# Patient Record
Sex: Female | Born: 1985 | Race: Black or African American | Hispanic: No | Marital: Single | Smoking: Former smoker
Health system: Southern US, Community
[De-identification: ages and names within clinical notes are randomized; demographics above are authoritative.]

## PROBLEM LIST (undated history)

## (undated) ENCOUNTER — Inpatient Hospital Stay (HOSPITAL_COMMUNITY): Payer: Self-pay

## (undated) DIAGNOSIS — Z789 Other specified health status: Secondary | ICD-10-CM

## (undated) DIAGNOSIS — N390 Urinary tract infection, site not specified: Secondary | ICD-10-CM

## (undated) DIAGNOSIS — C50919 Malignant neoplasm of unspecified site of unspecified female breast: Secondary | ICD-10-CM

## (undated) HISTORY — PX: MASTECTOMY: SHX3

## (undated) HISTORY — PX: DILATION AND CURETTAGE OF UTERUS: SHX78

## (undated) HISTORY — PX: PLACEMENT OF BREAST IMPLANTS: SHX6334

---
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
## 2007-03-22 ENCOUNTER — Emergency Department (HOSPITAL_COMMUNITY): Admission: EM | Admit: 2007-03-22 | Discharge: 2007-03-22 | Payer: Self-pay | Admitting: Emergency Medicine

## 2009-01-17 ENCOUNTER — Emergency Department (HOSPITAL_COMMUNITY): Admission: EM | Admit: 2009-01-17 | Discharge: 2009-01-17 | Payer: Self-pay | Admitting: Emergency Medicine

## 2009-08-02 ENCOUNTER — Emergency Department (HOSPITAL_COMMUNITY): Admission: EM | Admit: 2009-08-02 | Discharge: 2009-08-02 | Payer: Self-pay | Admitting: Emergency Medicine

## 2010-05-26 ENCOUNTER — Emergency Department (HOSPITAL_COMMUNITY): Admission: EM | Admit: 2010-05-26 | Discharge: 2010-05-27 | Payer: Self-pay | Admitting: Emergency Medicine

## 2010-12-12 LAB — DIFFERENTIAL
Basophils Absolute: 0.1 10*3/uL (ref 0.0–0.1)
Basophils Relative: 1 % (ref 0–1)
Eosinophils Absolute: 0.1 10*3/uL (ref 0.0–0.7)
Eosinophils Relative: 1 % (ref 0–5)
Lymphocytes Relative: 22 % (ref 12–46)
Lymphs Abs: 2.2 10*3/uL (ref 0.7–4.0)
Monocytes Absolute: 0.6 10*3/uL (ref 0.1–1.0)
Monocytes Relative: 6 % (ref 3–12)
Neutro Abs: 7.2 10*3/uL (ref 1.7–7.7)
Neutrophils Relative %: 71 % (ref 43–77)

## 2010-12-12 LAB — URINALYSIS, ROUTINE W REFLEX MICROSCOPIC
Bilirubin Urine: NEGATIVE
Glucose, UA: NEGATIVE mg/dL
Ketones, ur: 15 mg/dL — AB
Nitrite: NEGATIVE
Protein, ur: NEGATIVE mg/dL
Specific Gravity, Urine: 1.021 (ref 1.005–1.030)
Urobilinogen, UA: 0.2 mg/dL (ref 0.0–1.0)
pH: 5 (ref 5.0–8.0)

## 2010-12-12 LAB — URINE MICROSCOPIC-ADD ON

## 2010-12-12 LAB — CBC
HCT: 36 % (ref 36.0–46.0)
Hemoglobin: 12.5 g/dL (ref 12.0–15.0)
MCH: 27.7 pg (ref 26.0–34.0)
MCHC: 34.7 g/dL (ref 30.0–36.0)
MCV: 79.6 fL (ref 78.0–100.0)
Platelets: 256 10*3/uL (ref 150–400)
RBC: 4.52 MIL/uL (ref 3.87–5.11)
RDW: 13.6 % (ref 11.5–15.5)
WBC: 10.2 10*3/uL (ref 4.0–10.5)

## 2010-12-12 LAB — WET PREP, GENITAL
Clue Cells Wet Prep HPF POC: NONE SEEN
Trich, Wet Prep: NONE SEEN
WBC, Wet Prep HPF POC: NONE SEEN
Yeast Wet Prep HPF POC: NONE SEEN

## 2010-12-12 LAB — GC/CHLAMYDIA PROBE AMP, GENITAL
Chlamydia, DNA Probe: NEGATIVE
GC Probe Amp, Genital: NEGATIVE

## 2010-12-12 LAB — PREGNANCY, URINE: Preg Test, Ur: NEGATIVE

## 2010-12-31 LAB — POCT PREGNANCY, URINE: Preg Test, Ur: NEGATIVE

## 2010-12-31 LAB — GC/CHLAMYDIA PROBE AMP, GENITAL
Chlamydia, DNA Probe: NEGATIVE
GC Probe Amp, Genital: NEGATIVE

## 2010-12-31 LAB — WET PREP, GENITAL
Clue Cells Wet Prep HPF POC: NONE SEEN
Trich, Wet Prep: NONE SEEN
Yeast Wet Prep HPF POC: NONE SEEN

## 2010-12-31 LAB — URINE MICROSCOPIC-ADD ON

## 2010-12-31 LAB — URINALYSIS, ROUTINE W REFLEX MICROSCOPIC
Bilirubin Urine: NEGATIVE
Glucose, UA: NEGATIVE mg/dL
Ketones, ur: NEGATIVE mg/dL
Nitrite: POSITIVE — AB
Protein, ur: NEGATIVE mg/dL
Specific Gravity, Urine: 1.023 (ref 1.005–1.030)
Urobilinogen, UA: 0.2 mg/dL (ref 0.0–1.0)
pH: 6 (ref 5.0–8.0)

## 2011-01-03 ENCOUNTER — Emergency Department (HOSPITAL_COMMUNITY): Payer: Self-pay

## 2011-01-03 ENCOUNTER — Emergency Department (HOSPITAL_COMMUNITY)
Admission: EM | Admit: 2011-01-03 | Discharge: 2011-01-04 | Disposition: A | Discharge status: Home / Self Care | Payer: Self-pay | Attending: Emergency Medicine | Admitting: Emergency Medicine

## 2011-01-03 DIAGNOSIS — R51 Headache: Secondary | ICD-10-CM | POA: Insufficient documentation

## 2011-01-03 DIAGNOSIS — R079 Chest pain, unspecified: Secondary | ICD-10-CM | POA: Insufficient documentation

## 2011-01-03 DIAGNOSIS — R0789 Other chest pain: Secondary | ICD-10-CM | POA: Insufficient documentation

## 2011-01-07 LAB — URINALYSIS, ROUTINE W REFLEX MICROSCOPIC
Bilirubin Urine: NEGATIVE
Glucose, UA: NEGATIVE mg/dL
Ketones, ur: NEGATIVE mg/dL
Nitrite: POSITIVE — AB
Protein, ur: 30 mg/dL — AB
Specific Gravity, Urine: 1.02 (ref 1.005–1.030)
Urobilinogen, UA: 1 mg/dL (ref 0.0–1.0)
pH: 7 (ref 5.0–8.0)

## 2011-01-07 LAB — URINE MICROSCOPIC-ADD ON

## 2011-01-07 LAB — GC/CHLAMYDIA PROBE AMP, GENITAL
Chlamydia, DNA Probe: NEGATIVE
GC Probe Amp, Genital: NEGATIVE

## 2011-01-07 LAB — WET PREP, GENITAL
Trich, Wet Prep: NONE SEEN
Yeast Wet Prep HPF POC: NONE SEEN

## 2011-01-07 LAB — CBC
HCT: 36.1 % (ref 36.0–46.0)
Hemoglobin: 12.3 g/dL (ref 12.0–15.0)
MCHC: 34.1 g/dL (ref 30.0–36.0)
MCV: 82.4 fL (ref 78.0–100.0)
Platelets: 196 10*3/uL (ref 150–400)
RBC: 4.38 MIL/uL (ref 3.87–5.11)
RDW: 13.1 % (ref 11.5–15.5)
WBC: 6.2 10*3/uL (ref 4.0–10.5)

## 2011-01-07 LAB — DIFFERENTIAL
Basophils Absolute: 0 10*3/uL (ref 0.0–0.1)
Basophils Relative: 0 % (ref 0–1)
Eosinophils Absolute: 0.1 10*3/uL (ref 0.0–0.7)
Eosinophils Relative: 1 % (ref 0–5)
Lymphocytes Relative: 17 % (ref 12–46)
Lymphs Abs: 1 10*3/uL (ref 0.7–4.0)
Monocytes Absolute: 0.4 10*3/uL (ref 0.1–1.0)
Monocytes Relative: 7 % (ref 3–12)
Neutro Abs: 4.7 10*3/uL (ref 1.7–7.7)
Neutrophils Relative %: 76 % (ref 43–77)

## 2011-01-07 LAB — POCT I-STAT, CHEM 8
BUN: 10 mg/dL (ref 6–23)
Calcium, Ion: 1.18 mmol/L (ref 1.12–1.32)
Chloride: 106 mEq/L (ref 96–112)
Creatinine, Ser: 0.9 mg/dL (ref 0.4–1.2)
Glucose, Bld: 103 mg/dL — ABNORMAL HIGH (ref 70–99)
HCT: 38 % (ref 36.0–46.0)
Hemoglobin: 12.9 g/dL (ref 12.0–15.0)
Potassium: 3.7 mEq/L (ref 3.5–5.1)
Sodium: 139 mEq/L (ref 135–145)
TCO2: 26 mmol/L (ref 0–100)

## 2011-01-07 LAB — POCT PREGNANCY, URINE: Preg Test, Ur: NEGATIVE

## 2011-06-04 ENCOUNTER — Emergency Department (HOSPITAL_COMMUNITY)
Admission: EM | Admit: 2011-06-04 | Discharge: 2011-06-05 | Disposition: A | Discharge status: Home / Self Care | Payer: Self-pay | Attending: Emergency Medicine | Admitting: Emergency Medicine

## 2011-06-04 DIAGNOSIS — R109 Unspecified abdominal pain: Secondary | ICD-10-CM | POA: Insufficient documentation

## 2011-06-04 DIAGNOSIS — R10819 Abdominal tenderness, unspecified site: Secondary | ICD-10-CM | POA: Insufficient documentation

## 2011-06-04 DIAGNOSIS — R350 Frequency of micturition: Secondary | ICD-10-CM | POA: Insufficient documentation

## 2011-06-04 DIAGNOSIS — N390 Urinary tract infection, site not specified: Secondary | ICD-10-CM | POA: Insufficient documentation

## 2011-06-04 DIAGNOSIS — R300 Dysuria: Secondary | ICD-10-CM | POA: Insufficient documentation

## 2011-06-05 LAB — URINALYSIS, ROUTINE W REFLEX MICROSCOPIC
Bilirubin Urine: NEGATIVE
Hgb urine dipstick: NEGATIVE
Nitrite: NEGATIVE
Protein, ur: NEGATIVE mg/dL
Urobilinogen, UA: 2 mg/dL — ABNORMAL HIGH (ref 0.0–1.0)

## 2011-06-05 LAB — POCT PREGNANCY, URINE: Preg Test, Ur: NEGATIVE

## 2011-06-05 LAB — URINE CULTURE
Colony Count: NO GROWTH
Culture: NO GROWTH

## 2011-06-05 LAB — URINE MICROSCOPIC-ADD ON

## 2012-04-02 ENCOUNTER — Emergency Department (HOSPITAL_COMMUNITY)
Admission: EM | Admit: 2012-04-02 | Discharge: 2012-04-02 | Disposition: A | Discharge status: Home / Self Care | Payer: Self-pay | Attending: Emergency Medicine | Admitting: Emergency Medicine

## 2012-04-02 ENCOUNTER — Encounter (HOSPITAL_COMMUNITY): Payer: Self-pay | Admitting: *Deleted

## 2012-04-02 DIAGNOSIS — F17200 Nicotine dependence, unspecified, uncomplicated: Secondary | ICD-10-CM | POA: Insufficient documentation

## 2012-04-02 DIAGNOSIS — N390 Urinary tract infection, site not specified: Secondary | ICD-10-CM | POA: Insufficient documentation

## 2012-04-02 HISTORY — DX: Urinary tract infection, site not specified: N39.0

## 2012-04-02 LAB — URINALYSIS, ROUTINE W REFLEX MICROSCOPIC
Bilirubin Urine: NEGATIVE
Protein, ur: NEGATIVE mg/dL
Urobilinogen, UA: 1 mg/dL (ref 0.0–1.0)

## 2012-04-02 LAB — WET PREP, GENITAL
Trich, Wet Prep: NONE SEEN
Yeast Wet Prep HPF POC: NONE SEEN

## 2012-04-02 LAB — URINE MICROSCOPIC-ADD ON

## 2012-04-02 MED ORDER — IBUPROFEN 600 MG PO TABS: 600.0000 mg | ORAL_TABLET | Freq: Four times a day (QID) | ORAL | Status: AC | PRN

## 2012-04-02 MED ORDER — ACETAMINOPHEN 325 MG PO TABS
650.0000 mg | ORAL_TABLET | Freq: Once | ORAL | Status: AC
  Administered 2012-04-02: 650 mg via ORAL
  Filled 2012-04-02: qty 2

## 2012-04-02 MED ORDER — NITROFURANTOIN MONOHYD MACRO 100 MG PO CAPS: 100.0000 mg | ORAL_CAPSULE | Freq: Two times a day (BID) | ORAL | Status: AC

## 2012-04-02 MED ORDER — PHENAZOPYRIDINE HCL 100 MG PO TABS
95.0000 mg | ORAL_TABLET | Freq: Once | ORAL | Status: AC
  Administered 2012-04-02: 100 mg via ORAL
  Filled 2012-04-02: qty 1

## 2012-04-02 NOTE — ED Notes (Signed)
Pelvic cart set up at the bedside. Pt resting quietly at the time. No signs of distress noted.

## 2012-04-02 NOTE — ED Notes (Signed)
Pt presents to department for evaluation of RLQ abdominal pain, hematuria, dysuria, and vaginal discharge. Ongoing since Thursday. LMP: 03/13/12. 7/10 pain, increases with movement, describes pain as "cramping" sensation. RLQ tender to palpation. Bowel sounds present all quadrants. No nausea/vomiting. She is alert and oriented x4. No signs of distress noted at the present.

## 2012-04-02 NOTE — ED Notes (Signed)
Pt reports right lower quadrant pain with blood in urine. Pt reports hx of utis causing hematuria.

## 2012-04-02 NOTE — ED Provider Notes (Signed)
History     CSN: [id number]  Arrival date & time 04/02/12  0917   First MD Initiated Contact with Patient 04/02/12 1007      Chief Complaint  Patient presents with  . Abdominal Pain  . Hematuria    (Consider location/radiation/quality/duration/timing/severity/associated sxs/prior treatment) Patient is a 26 y.o. female presenting with abdominal pain. The history is provided by the patient.  Abdominal Pain The primary symptoms of the illness include abdominal pain, dysuria and vaginal discharge. The primary symptoms of the illness do not include fever, fatigue, nausea, vomiting or vaginal bleeding. The current episode started more than 2 days ago. The onset of the illness was sudden. The problem has been gradually worsening.  The dysuria is associated with hematuria.  The vaginal discharge is associated with dysuria.   Additional symptoms associated with the illness include hematuria. Symptoms associated with the illness do not include chills.  Pt sates pain urination, urinary frequency, urgency, blood inurine and when wipes. States no vaginal bleeding. States used a pad, and no blood on the pad. Does admit to increased vaginal discharge that is white. States pain in right lower quadrant radiating into right flank. No fever, nausea, vomiting.   Past Medical History  Diagnosis Date  . Urinary tract infection     History reviewed. No pertinent past surgical history.  History reviewed. No pertinent family history.  History  Substance Use Topics  . Smoking status: Current Everyday Smoker  . Smokeless tobacco: Not on file  . Alcohol Use: Yes     occ    OB History    Grav Para Term Preterm Abortions TAB SAB Ect Mult Living                  Review of Systems  Constitutional: Negative for fever, chills and fatigue.  Respiratory: Negative.   Cardiovascular: Negative.   Gastrointestinal: Positive for abdominal pain. Negative for nausea and vomiting.  Genitourinary: Positive  for dysuria, hematuria and vaginal discharge. Negative for flank pain and vaginal bleeding.  Skin: Negative.   Neurological: Negative for dizziness and weakness.    Allergies  Review of patient's allergies indicates no known allergies.  Home Medications   Current Outpatient Rx  Name Route Sig Dispense Refill  . IRON PO TABS Oral Take 1 tablet by mouth daily.      BP 121/78  Pulse 120  Temp 98.9 F (37.2 C) (Oral)  Resp 16  SpO2 99%  Physical Exam  Nursing note and vitals reviewed. Constitutional: She appears well-developed and well-nourished. No distress.  Eyes: Conjunctivae are normal.  Neck: Neck supple.  Cardiovascular: Normal rate, regular rhythm and normal heart sounds.   Pulmonary/Chest: Effort normal and breath sounds normal. No respiratory distress. She has no wheezes. She has no rales.  Abdominal: Soft. Bowel sounds are normal. She exhibits no distension. There is no rebound.       Suprapubic, RLQ tenderness. No guarding, no rebound tenderness. Right CVA tenderness  Genitourinary:       Normal external genetalia. Small white discharge. No CMT. Mild uterine/pubic tenderness. No adnexal tenderness  Neurological: She is alert.  Skin: Skin is warm and dry.  Psychiatric: She has a normal mood and affect.    ED Course  Procedures (including critical care time)  Pt with urinary symptoms. ua pending. Will do pelvic. VS unremarkable.   Filed Vitals:   04/02/12 1051  BP: 114/79  Pulse: 94  Temp: 98.8 F (37.1 C)  Resp: 18  No surgical abdomen.   Results for orders placed during the hospital encounter of 04/02/12  URINALYSIS, ROUTINE W REFLEX MICROSCOPIC      Component Value Range   Color, Urine YELLOW  YELLOW   APPearance CLOUDY (*) CLEAR   Specific Gravity, Urine 1.021  1.005 - 1.030   pH 5.5  5.0 - 8.0   Glucose, UA NEGATIVE  NEGATIVE mg/dL   Hgb urine dipstick MODERATE (*) NEGATIVE   Bilirubin Urine NEGATIVE  NEGATIVE   Ketones, ur NEGATIVE  NEGATIVE  mg/dL   Protein, ur NEGATIVE  NEGATIVE mg/dL   Urobilinogen, UA 1.0  0.0 - 1.0 mg/dL   Nitrite POSITIVE (*) NEGATIVE   Leukocytes, UA MODERATE (*) NEGATIVE  POCT PREGNANCY, URINE      Component Value Range   Preg Test, Ur NEGATIVE  NEGATIVE  URINE MICROSCOPIC-ADD ON      Component Value Range   Squamous Epithelial / LPF FEW (*) RARE   WBC, UA 21-50  <3 WBC/hpf   RBC / HPF 3-6  <3 RBC/hpf   Bacteria, UA MANY (*) RARE  WET PREP, GENITAL      Component Value Range   Yeast Wet Prep HPF POC NONE SEEN  NONE SEEN   Trich, Wet Prep NONE SEEN  NONE SEEN   Clue Cells Wet Prep HPF POC NONE SEEN  NONE SEEN   WBC, Wet Prep HPF POC FEW (*) NONE SEEN   No results found.  11:42 AM Unremarkable pelvic exam. UA infected although contaminated with few epithelial cells. Based on symptoms nad 21-50 WBCs and many bacteria will treat. Will start on Macrobid. Follow up for recheck in 1 wk. Pt non toxic. No signs of pyelonephritis, afebrile, no NV.    1. UTI (lower urinary tract infection)       MDM          Lottie Mussel, PA 04/02/12 1516

## 2012-04-03 NOTE — ED Provider Notes (Signed)
Medical screening examination/treatment/procedure(s) were performed by non-physician practitioner and as supervising physician I was immediately available for consultation/collaboration.    Nelia Shi, MD 04/03/12 [phone]

## 2012-04-04 LAB — GC/CHLAMYDIA PROBE AMP, GENITAL
Chlamydia, DNA Probe: NEGATIVE
GC Probe Amp, Genital: NEGATIVE

## 2012-10-20 ENCOUNTER — Encounter (HOSPITAL_COMMUNITY): Payer: Self-pay | Admitting: Emergency Medicine

## 2012-10-20 ENCOUNTER — Emergency Department (HOSPITAL_COMMUNITY): Payer: Self-pay

## 2012-10-20 ENCOUNTER — Emergency Department (HOSPITAL_COMMUNITY)
Admission: EM | Admit: 2012-10-20 | Discharge: 2012-10-20 | Disposition: A | Discharge status: Home / Self Care | Payer: Self-pay | Attending: Emergency Medicine | Admitting: Emergency Medicine

## 2012-10-20 DIAGNOSIS — Z3490 Encounter for supervision of normal pregnancy, unspecified, unspecified trimester: Secondary | ICD-10-CM

## 2012-10-20 DIAGNOSIS — Z87440 Personal history of urinary (tract) infections: Secondary | ICD-10-CM | POA: Insufficient documentation

## 2012-10-20 DIAGNOSIS — Z3201 Encounter for pregnancy test, result positive: Secondary | ICD-10-CM | POA: Insufficient documentation

## 2012-10-20 DIAGNOSIS — N898 Other specified noninflammatory disorders of vagina: Secondary | ICD-10-CM | POA: Insufficient documentation

## 2012-10-20 DIAGNOSIS — A5901 Trichomonal vulvovaginitis: Secondary | ICD-10-CM | POA: Insufficient documentation

## 2012-10-20 DIAGNOSIS — R109 Unspecified abdominal pain: Secondary | ICD-10-CM

## 2012-10-20 DIAGNOSIS — R350 Frequency of micturition: Secondary | ICD-10-CM | POA: Insufficient documentation

## 2012-10-20 DIAGNOSIS — A599 Trichomoniasis, unspecified: Secondary | ICD-10-CM

## 2012-10-20 DIAGNOSIS — F17200 Nicotine dependence, unspecified, uncomplicated: Secondary | ICD-10-CM | POA: Insufficient documentation

## 2012-10-20 DIAGNOSIS — N949 Unspecified condition associated with female genital organs and menstrual cycle: Secondary | ICD-10-CM | POA: Insufficient documentation

## 2012-10-20 DIAGNOSIS — R1032 Left lower quadrant pain: Secondary | ICD-10-CM | POA: Insufficient documentation

## 2012-10-20 LAB — POCT I-STAT, CHEM 8
BUN: 6 mg/dL (ref 6–23)
Calcium, Ion: 1.35 mmol/L — ABNORMAL HIGH (ref 1.12–1.23)
Chloride: 103 mEq/L (ref 96–112)
Creatinine, Ser: 0.7 mg/dL (ref 0.50–1.10)
Glucose, Bld: 78 mg/dL (ref 70–99)
HCT: 40 % (ref 36.0–46.0)
Hemoglobin: 13.6 g/dL (ref 12.0–15.0)
Potassium: 3.5 mEq/L (ref 3.5–5.1)
Sodium: 138 mEq/L (ref 135–145)
TCO2: 23 mmol/L (ref 0–100)

## 2012-10-20 LAB — URINALYSIS, ROUTINE W REFLEX MICROSCOPIC
Bilirubin Urine: NEGATIVE
Glucose, UA: NEGATIVE mg/dL
Ketones, ur: NEGATIVE mg/dL
Nitrite: POSITIVE — AB
Protein, ur: NEGATIVE mg/dL
Specific Gravity, Urine: 1.019 (ref 1.005–1.030)
Urobilinogen, UA: 0.2 mg/dL (ref 0.0–1.0)
pH: 5.5 (ref 5.0–8.0)

## 2012-10-20 LAB — POCT PREGNANCY, URINE: Preg Test, Ur: POSITIVE — AB

## 2012-10-20 LAB — WET PREP, GENITAL
Clue Cells Wet Prep HPF POC: NONE SEEN
Yeast Wet Prep HPF POC: NONE SEEN

## 2012-10-20 LAB — URINE MICROSCOPIC-ADD ON

## 2012-10-20 MED ORDER — METRONIDAZOLE 0.75 % VA GEL: 1.0000 | Freq: Two times a day (BID) | VAGINAL | Status: DC

## 2012-10-20 MED ORDER — PRENATAL COMPLETE 14-0.4 MG PO TABS: 1.0000 | ORAL_TABLET | Freq: Every day | ORAL | Status: DC

## 2012-10-20 NOTE — ED Provider Notes (Signed)
Pt left to me at  change of shift to get pelvic and US done. Patient is G2 P0 Ab1, last normal period November 27. She presents with left lower quadrant pain.  Pt is alert and in NAD.  Rest of exam done by Dr Oletta Lamas Pelvic Exam Normal external genitalia moderate white thick vaginal discharge. Patient has normal sized nontender uterus. Her right adnexa is minimally tender but she's very tender over the left adnexa without obvious masses.  Pt given results of her [place] and need to f/u for prenatal care  Results for orders placed during the hospital encounter of 10/20/12  URINALYSIS, ROUTINE W REFLEX MICROSCOPIC      Component Value Range   Color, Urine YELLOW  YELLOW   APPearance CLOUDY (*) CLEAR   Specific Gravity, Urine 1.019  1.005 - 1.030   pH 5.5  5.0 - 8.0   Glucose, UA NEGATIVE  NEGATIVE mg/dL   Hgb urine dipstick SMALL (*) NEGATIVE   Bilirubin Urine NEGATIVE  NEGATIVE   Ketones, ur NEGATIVE  NEGATIVE mg/dL   Protein, ur NEGATIVE  NEGATIVE mg/dL   Urobilinogen, UA 0.2  0.0 - 1.0 mg/dL   Nitrite POSITIVE (*) NEGATIVE   Leukocytes, UA MODERATE (*) NEGATIVE  POCT PREGNANCY, URINE      Component Value Range   Preg Test, Ur POSITIVE (*) NEGATIVE  URINE MICROSCOPIC-ADD ON      Component Value Range   Squamous Epithelial / LPF MANY (*) RARE   WBC, UA 11-20  <3 WBC/hpf   RBC / HPF 3-6  <3 RBC/hpf   Bacteria, UA FEW (*) RARE   Urine-Other TRICHOMONAS PRESENT    WET PREP, GENITAL      Component Value Range   Yeast Wet Prep HPF POC NONE SEEN  NONE SEEN   Trich, Wet Prep FEW (*) NONE SEEN   Clue Cells Wet Prep HPF POC NONE SEEN  NONE SEEN   WBC, Wet Prep HPF POC MODERATE (*) NONE SEEN  HCG, QUANTITATIVE, PREGNANCY      Component Value Range   hCG, Beta Chain, Quant, S 110149 (*) <5 mIU/mL  POCT I-STAT, CHEM 8      Component Value Range   Sodium 138  135 - 145 mEq/L   Potassium 3.5  3.5 - 5.1 mEq/L   Chloride 103  96 - 112 mEq/L   BUN 6  6 - 23 mg/dL   Creatinine, Ser 1.61  0.50 -  1.10 mg/dL   Glucose, Bld 78  70 - 99 mg/dL   Calcium, Ion 0.96 (*) 1.12 - 1.23 mmol/L   TCO2 23  0 - 100 mmol/L   Hemoglobin 13.6  12.0 - 15.0 g/dL   HCT 04.5  40.9 - 81.1 %   US Ob Comp Less 14 Wks US Ob Transvaginal  10/20/2012  *RADIOLOGY REPORT*  Clinical Data: Left lower quadrant pain.  OBSTETRIC <14 WK [place] AND TRANSVAGINAL OB US  Technique:  Both transabdominal and transvaginal ultrasound examinations were performed for complete evaluation of the gestation as well as the maternal uterus, adnexal regions, and pelvic cul-de-sac.  Transvaginal technique was performed to assess early pregnancy.  Comparison:  05/26/2010  Intrauterine gestational sac:  Visualized/normal in shape. Yolk sac: Present Embryo: Present Cardiac Activity: Present Heart Rate: 168 bpm  CRL: 17.1  mm  8 w  1 d             [place] EDC: 05/31/2013  Maternal uterus/adnexae: Normal appearance of the right ovary measuring  2.1 x 1.5 x 2.5 cm. Left ovary measures 2.4 x 1.5 x 2.3 cm.  Within the left ovary, there is a central area of hypoechogenicity which could represent a collapsed cyst or corpus luteum, measuring up to 1.1 cm.  No significant subchorionic hemorrhage.  IMPRESSION: Single live intrauterine pregnancy.  Calculated gestational age by ultrasound is 8 weeks, 1 day.   Original Report Authenticated By: Richarda Overlie, M.D.       Diagnoses that have been ruled out:  None  Diagnoses that are still under consideration:  None  Final diagnoses:  Pregnancy  Abdominal pain  Trichomonas    New Prescriptions   METRONIDAZOLE (METROGEL VAGINAL) 0.75 % VAGINAL GEL    Place 1 Applicatorful vaginally 2 (two) times daily.   PRENATAL VIT-FE FUMARATE-FA (PRENATAL COMPLETE) 14-0.4 MG TABS    Take 1 tablet by mouth daily.    Plan discharge  Devoria Albe, MD, Franz Dell, MD 10/20/12 [phone]

## 2012-10-20 NOTE — ED Notes (Signed)
Pt presents to ED today with c/o lower abdominal pain and nausea for "about a week or so".

## 2012-10-20 NOTE — ED Provider Notes (Signed)
History     CSN: [id number]  Arrival date & time 10/20/12  1131   First MD Initiated Contact with Patient 10/20/12 1353      Chief Complaint  Patient presents with  . Abdominal Pain    (Consider location/radiation/quality/duration/timing/severity/associated sxs/prior treatment) HPI Comments: Pt with about 1 week of persistent, mild to moderate LLQ pain, no N/V/D, no constipation, rectal bleeding.  LMP was late November, reports she can be irregular at times.  [facility] and had lab drawn to "confirm pregnancy" but doesn't know result and they didn't examine her so came to the ED.  No fevers, chills.  Is sexually active, no protection, 1 partner.  She has had a discharge, unusual.  Also some urinary freq, but no dysuria.  She has had prior UTI in the past and notes some similarity to symptoms . Pain doesn't radiate into back.  Has not taken any specific medications for symptoms.  She is a G1P1 with 1 termination in the past.    Patient is a 27 y.o. female presenting with abdominal pain. The history is provided by the patient.  Abdominal Pain The primary symptoms of the illness include abdominal pain and vaginal discharge. The primary symptoms of the illness do not include fever, nausea, vomiting, diarrhea, dysuria or vaginal bleeding.  The vaginal discharge is not associated with dysuria.  Additional symptoms associated with the illness include frequency. Symptoms associated with the illness do not include chills, constipation or back pain.    Past Medical History  Diagnosis Date  . Urinary tract infection     History reviewed. No pertinent past surgical history.  History reviewed. No pertinent family history.  History  Substance Use Topics  . Smoking status: Current Every Day Smoker  . Smokeless tobacco: Not on file  . Alcohol Use: Yes     Comment: occ    OB History    Grav Para Term Preterm Abortions TAB SAB Ect Mult Living                   Review of Systems  Constitutional: Negative for fever, chills and appetite change.  Gastrointestinal: Positive for abdominal pain. Negative for nausea, vomiting, diarrhea, constipation, abdominal distention and rectal pain.  Genitourinary: Positive for frequency, vaginal discharge and pelvic pain. Negative for dysuria and vaginal bleeding.  Musculoskeletal: Negative for back pain.  Neurological: Negative for dizziness and light-headedness.  All other systems reviewed and are negative.    Allergies  Review of patient's allergies indicates no known allergies.  Home Medications  No current outpatient prescriptions on file.  BP 122/87  Pulse 86  Temp 98.9 F (37.2 C) (Oral)  SpO2 100%  Physical Exam  Nursing note and vitals reviewed. Constitutional: She appears well-developed and well-nourished.  HENT:  Head: Normocephalic.  Eyes: Conjunctivae normal and EOM are normal.  Cardiovascular: Normal rate.   Pulmonary/Chest: Effort normal. No respiratory distress.  Abdominal: Soft. She exhibits no distension. There is no tenderness. There is no rebound.  Neurological: She is alert.  Skin: Skin is warm and dry. No rash noted.  Psychiatric: She has a normal mood and affect.    ED Course  Procedures (including critical care time)  Labs Reviewed  URINALYSIS, ROUTINE W REFLEX MICROSCOPIC - Abnormal; Notable for the following:    APPearance CLOUDY (*)     Hgb urine dipstick SMALL (*)     Nitrite POSITIVE (*)     Leukocytes, UA MODERATE (*)  All other components within normal limits  POCT PREGNANCY, URINE - Abnormal; Notable for the following:    Preg Test, Ur POSITIVE (*)     All other components within normal limits  URINE MICROSCOPIC-ADD ON - Abnormal; Notable for the following:    Squamous Epithelial / LPF MANY (*)     Bacteria, UA FEW (*)     All other components within normal limits  WET PREP, GENITAL - Abnormal; Notable for the following:    Trich, Wet Prep FEW (*)      WBC, Wet Prep HPF POC MODERATE (*)     All other components within normal limits  HCG, QUANTITATIVE, PREGNANCY - Abnormal; Notable for the following:    hCG, Beta Chain, Quant, S 110149 (*)     All other components within normal limits  POCT I-STAT, CHEM 8 - Abnormal; Notable for the following:    Calcium, Ion 1.35 (*)     All other components within normal limits  URINE CULTURE  GC/CHLAMYDIA PROBE AMP   US Ob Comp Less 14 Wks  10/20/2012  *RADIOLOGY REPORT*  Clinical Data: Left lower quadrant pain.  OBSTETRIC <14 WK [place] AND TRANSVAGINAL OB US  Technique:  Both transabdominal and transvaginal ultrasound examinations were performed for complete evaluation of the gestation as well as the maternal uterus, adnexal regions, and pelvic cul-de-sac.  Transvaginal technique was performed to assess early pregnancy.  Comparison:  05/26/2010  Intrauterine gestational sac:  Visualized/normal in shape. Yolk sac: Present Embryo: Present Cardiac Activity: Present Heart Rate: 168 bpm  CRL: 17.1  mm  8 w  1 d             [place] EDC: 05/31/2013  Maternal uterus/adnexae: Normal appearance of the right ovary measuring 2.1 x 1.5 x 2.5 cm. Left ovary measures 2.4 x 1.5 x 2.3 cm.  Within the left ovary, there is a central area of hypoechogenicity which could represent a collapsed cyst or corpus luteum, measuring up to 1.1 cm.  No significant subchorionic hemorrhage.  IMPRESSION: Single live intrauterine pregnancy.  Calculated gestational age by ultrasound is 8 weeks, 1 day.   Original Report Authenticated By: Richarda Overlie, M.D.    US Ob Transvaginal  10/20/2012  *RADIOLOGY REPORT*  Clinical Data: Left lower quadrant pain.  OBSTETRIC <14 WK [place] AND TRANSVAGINAL OB US  Technique:  Both transabdominal and transvaginal ultrasound examinations were performed for complete evaluation of the gestation as well as the maternal uterus, adnexal regions, and pelvic cul-de-sac.  Transvaginal technique was performed to assess early pregnancy.   Comparison:  05/26/2010  Intrauterine gestational sac:  Visualized/normal in shape. Yolk sac: Present Embryo: Present Cardiac Activity: Present Heart Rate: 168 bpm  CRL: 17.1  mm  8 w  1 d             [place] EDC: 05/31/2013  Maternal uterus/adnexae: Normal appearance of the right ovary measuring 2.1 x 1.5 x 2.5 cm. Left ovary measures 2.4 x 1.5 x 2.3 cm.  Within the left ovary, there is a central area of hypoechogenicity which could represent a collapsed cyst or corpus luteum, measuring up to 1.1 cm.  No significant subchorionic hemorrhage.  IMPRESSION: Single live intrauterine pregnancy.  Calculated gestational age by ultrasound is 8 weeks, 1 day.   Original Report Authenticated By: Richarda Overlie, M.D.      1. Pregnancy   2. Abdominal pain     ra sat is 100% and i interpret to be normal  MDM  Pt with lower abd discomfort, no bleeding, with + urine preg here and [facility]  Pt will need pelvic exam, possibly U/S to exclude ectopic.  Pt is not toxic appearing.  Will sign out to Dr. Lynelle Doctor for completion of exam (pelvic) and disposition.          Gavin Pound. Aunisty Reali, MD 10/20/12 1730

## 2012-10-21 LAB — GC/CHLAMYDIA PROBE AMP
CT Probe RNA: NEGATIVE
GC Probe RNA: NEGATIVE

## 2012-10-22 LAB — URINE CULTURE: Colony Count: >=100000

## 2012-10-23 NOTE — ED Notes (Signed)
+   Urine Chart sent to EDP office for review. 

## 2012-10-26 NOTE — ED Notes (Signed)
Rx for Keflex 500 mg BID x 5 days per Felicie Morn [facility] needs to be called to pharmacy.Chart returned from EDP office

## 2012-10-29 ENCOUNTER — Telehealth (HOSPITAL_COMMUNITY): Payer: Self-pay | Admitting: Emergency Medicine

## 2012-10-30 ENCOUNTER — Telehealth (HOSPITAL_COMMUNITY): Payer: Self-pay | Admitting: Emergency Medicine

## 2012-10-30 NOTE — ED Notes (Signed)
Patient called back and was informed of +Urine and new Rx. Wants Rx called to [facility] on [address]. Rx called in by Norm Parcel PFM.

## 2013-03-30 ENCOUNTER — Inpatient Hospital Stay (HOSPITAL_COMMUNITY)
Admission: AD | Admit: 2013-03-30 | Discharge: 2013-03-30 | Disposition: A | Discharge status: Home / Self Care | Payer: Medicaid Other | Source: Ambulatory Visit | Attending: Obstetrics & Gynecology | Admitting: Obstetrics & Gynecology

## 2013-03-30 ENCOUNTER — Inpatient Hospital Stay (HOSPITAL_COMMUNITY): Payer: Medicaid Other

## 2013-03-30 ENCOUNTER — Encounter (HOSPITAL_COMMUNITY): Payer: Self-pay

## 2013-03-30 ENCOUNTER — Other Ambulatory Visit: Payer: Self-pay | Admitting: Family

## 2013-03-30 DIAGNOSIS — B9689 Other specified bacterial agents as the cause of diseases classified elsewhere: Secondary | ICD-10-CM

## 2013-03-30 DIAGNOSIS — O98819 Other maternal infectious and parasitic diseases complicating pregnancy, unspecified trimester: Secondary | ICD-10-CM | POA: Insufficient documentation

## 2013-03-30 DIAGNOSIS — N390 Urinary tract infection, site not specified: Secondary | ICD-10-CM | POA: Insufficient documentation

## 2013-03-30 DIAGNOSIS — N760 Acute vaginitis: Secondary | ICD-10-CM | POA: Insufficient documentation

## 2013-03-30 DIAGNOSIS — O2341 Unspecified infection of urinary tract in pregnancy, first trimester: Secondary | ICD-10-CM

## 2013-03-30 DIAGNOSIS — A599 Trichomoniasis, unspecified: Secondary | ICD-10-CM

## 2013-03-30 DIAGNOSIS — A5901 Trichomonal vulvovaginitis: Secondary | ICD-10-CM | POA: Insufficient documentation

## 2013-03-30 DIAGNOSIS — O209 Hemorrhage in early pregnancy, unspecified: Secondary | ICD-10-CM | POA: Insufficient documentation

## 2013-03-30 DIAGNOSIS — O2390 Unspecified genitourinary tract infection in pregnancy, unspecified trimester: Secondary | ICD-10-CM | POA: Insufficient documentation

## 2013-03-30 DIAGNOSIS — A499 Bacterial infection, unspecified: Secondary | ICD-10-CM

## 2013-03-30 HISTORY — DX: Other specified health status: Z78.9

## 2013-03-30 LAB — URINALYSIS, ROUTINE W REFLEX MICROSCOPIC
Bilirubin Urine: NEGATIVE
Ketones, ur: NEGATIVE mg/dL
Protein, ur: NEGATIVE mg/dL
Urobilinogen, UA: 0.2 mg/dL (ref 0.0–1.0)

## 2013-03-30 LAB — CBC
Platelets: 202 10*3/uL (ref 150–400)
RBC: 4.4 MIL/uL (ref 3.87–5.11)
RDW: 12.9 % (ref 11.5–15.5)
WBC: 7.9 10*3/uL (ref 4.0–10.5)

## 2013-03-30 LAB — WET PREP, GENITAL: Yeast Wet Prep HPF POC: NONE SEEN

## 2013-03-30 LAB — URINE MICROSCOPIC-ADD ON

## 2013-03-30 LAB — OB RESULTS CONSOLE GBS: GBS: POSITIVE

## 2013-03-30 LAB — HCG, QUANTITATIVE, PREGNANCY: hCG, Beta Chain, Quant, S: 147978 m[IU]/mL — ABNORMAL HIGH (ref <5)

## 2013-03-30 MED ORDER — METRONIDAZOLE 500 MG PO TABS: 500.0000 mg | ORAL_TABLET | Freq: Two times a day (BID) | ORAL | Status: DC

## 2013-03-30 MED ORDER — METRONIDAZOLE 500 MG PO TABS: 2000.0000 mg | ORAL_TABLET | Freq: Once | ORAL | Status: DC

## 2013-03-30 MED ORDER — NITROFURANTOIN MONOHYD MACRO 100 MG PO CAPS: 100.0000 mg | ORAL_CAPSULE | Freq: Two times a day (BID) | ORAL | Status: DC

## 2013-03-30 MED ORDER — CEPHALEXIN 500 MG PO CAPS: 500.0000 mg | ORAL_CAPSULE | Freq: Three times a day (TID) | ORAL | Status: DC

## 2013-03-30 NOTE — MAU Provider Note (Signed)
Attestation of Attending Supervision of Advanced Practitioner (PA/CNM/NP): Evaluation and management procedures were performed by the Advanced Practitioner under my supervision and collaboration.  I have reviewed the Advanced Practitioner's note and chart, and I agree with the management and plan.  Bakari Nikolai, MD, FACOG Attending Obstetrician & Gynecologist Faculty Practice, [facility]  

## 2013-03-30 NOTE — MAU Provider Note (Signed)
History     CSN: [id number]  Arrival date and time: 03/30/13 1300   First Provider Initiated Contact with Patient 03/30/13 1348      Chief Complaint  Patient presents with  . Possible Pregnancy  . Abdominal Cramping   HPI  Pt is a G3P0020 at 9.5 wks IUP here with report of vaginal bleeding that started 4 days ago.  Bleeding is less than a period, but there every time she wipes.  Reports bilateral pelvic pain.  No report of abnormal vaginal discharge.    Past Medical History  Diagnosis Date  . Urinary tract infection   . Medical history non-contributory     Past Surgical History  Procedure Laterality Date  . Dilation and curettage of uterus      History reviewed. No pertinent family history.  History  Substance Use Topics  . Smoking status: Current Every Day Smoker  . Smokeless tobacco: Never Used  . Alcohol Use: No     Comment: occ    Allergies: No Known Allergies  Prescriptions prior to admission  Medication Sig Dispense Refill  . Prenatal Vit-Fe Fumarate-FA (PRENATAL MULTIVITAMIN) TABS Take 1 tablet by mouth daily at 12 noon.        Review of Systems  Gastrointestinal: Positive for nausea and abdominal pain (bilateral pubic). Negative for vomiting.  Genitourinary:       Vaginal bleeding  All other systems reviewed and are negative.   Physical Exam   Blood pressure 123/64, pulse 77, temperature 98.3 F (36.8 C), temperature source Oral, resp. rate 16, height 5' 1.5" (1.562 m), weight 44.543 kg (98 lb 3.2 oz), last menstrual period 01/21/2013, SpO2 100.00%.  Physical Exam  Constitutional: She is oriented to person, place, and time. She appears well-developed and well-nourished. No distress.  HENT:  Head: Normocephalic.  Neck: Normal range of motion. Neck supple.  Cardiovascular: Normal rate, regular rhythm and normal heart sounds.   Respiratory: Effort normal and breath sounds normal. No respiratory distress.  GI: Soft. There is tenderness (mild).  There is no CVA tenderness.  Genitourinary: Uterus is enlarged. Cervix exhibits no motion tenderness and no discharge. Vaginal discharge (yellowish discharge) found.  Musculoskeletal: Normal range of motion.  Neurological: She is alert and oriented to person, place, and time.  Skin: Skin is warm and dry.  Psychiatric: She has a normal mood and affect.    MAU Course  Procedures Results for orders placed during the hospital encounter of 03/30/13 (from the past 24 hour(s))  URINALYSIS, ROUTINE W REFLEX MICROSCOPIC     Status: Abnormal   Collection Time    03/30/13  1:20 PM      Result Value Range   Color, Urine YELLOW  YELLOW   APPearance CLEAR  CLEAR   Specific Gravity, Urine 1.020  1.005 - 1.030   pH 8.0  5.0 - 8.0   Glucose, UA NEGATIVE  NEGATIVE mg/dL   Hgb urine dipstick LARGE (*) NEGATIVE   Bilirubin Urine NEGATIVE  NEGATIVE   Ketones, ur NEGATIVE  NEGATIVE mg/dL   Protein, ur NEGATIVE  NEGATIVE mg/dL   Urobilinogen, UA 0.2  0.0 - 1.0 mg/dL   Nitrite POSITIVE (*) NEGATIVE   Leukocytes, UA SMALL (*) NEGATIVE  URINE MICROSCOPIC-ADD ON     Status: Abnormal   Collection Time    03/30/13  1:20 PM      Result Value Range   Squamous Epithelial / LPF FEW (*) RARE   WBC, UA 21-50  <3 WBC/hpf  RBC / HPF 3-6  <3 RBC/hpf   Bacteria, UA MANY (*) RARE  POCT PREGNANCY, URINE     Status: Abnormal   Collection Time    03/30/13  1:23 PM      Result Value Range   Preg Test, Ur POSITIVE (*) NEGATIVE  WET PREP, GENITAL     Status: Abnormal   Collection Time    03/30/13  2:00 PM      Result Value Range   Yeast Wet Prep HPF POC NONE SEEN  NONE SEEN   Trich, Wet Prep MODERATE (*) NONE SEEN   Clue Cells Wet Prep HPF POC MODERATE (*) NONE SEEN   WBC, Wet Prep HPF POC FEW (*) NONE SEEN  CBC     Status: Abnormal   Collection Time    03/30/13  2:00 PM      Result Value Range   WBC 7.9  4.0 - 10.5 K/uL   RBC 4.40  3.87 - 5.11 MIL/uL   Hemoglobin 11.5 (*) 12.0 - 15.0 g/dL   HCT 16.1 (*)  09.6 - 46.0 %   MCV 75.7 (*) 78.0 - 100.0 fL   MCH 26.1  26.0 - 34.0 pg   MCHC 34.5  30.0 - 36.0 g/dL   RDW 04.5  40.9 - 81.1 %   Platelets 202  150 - 400 K/uL  HCG, QUANTITATIVE, PREGNANCY     Status: Abnormal   Collection Time    03/30/13  2:00 PM      Result Value Range   hCG, Beta Chain, [place]est [id number] (*) <5 mIU/mL  ABO/RH     Status: None   Collection Time    03/30/13  2:00 PM      Result Value Range   ABO/RH(D) O POS       Assessment and Plan  UTI in Pregnancy Trichomoniasis Bacterial Vaginosis  Plan: RX Macrobid 100mg  BID x 7 days Urine culture to lab Flagyl 500 mg BID x 7 days Explained partner will need to be treated. Given new EDD based on today's ultrasound.    [facility] 03/30/2013, 1:49 PM

## 2013-03-30 NOTE — MAU Note (Signed)
Pt states cramping pain since Monday, and has had spotting when wiping for past 3 days as well. Pain is bilateral

## 2013-03-30 NOTE — MAU Note (Signed)
Patient states she has had a positive pregnancy test at the [facility]. Has had spotting on tissue with voiding and abdominal cramping for 3 days.

## 2013-04-02 LAB — URINE CULTURE: Colony Count: >=100000

## 2013-04-03 ENCOUNTER — Other Ambulatory Visit: Payer: Self-pay | Admitting: Advanced Practice Midwife

## 2013-04-03 DIAGNOSIS — O2341 Unspecified infection of urinary tract in pregnancy, first trimester: Secondary | ICD-10-CM

## 2013-04-03 DIAGNOSIS — B951 Streptococcus, group B, as the cause of diseases classified elsewhere: Secondary | ICD-10-CM | POA: Insufficient documentation

## 2013-04-03 DIAGNOSIS — O2340 Unspecified infection of urinary tract in pregnancy, unspecified trimester: Secondary | ICD-10-CM | POA: Insufficient documentation

## 2013-04-03 NOTE — Progress Notes (Signed)
Urine culture + GBS, was treated with Keflex, no further treatment needed at this time. Will need prophylaxis in labor.

## 2013-04-15 ENCOUNTER — Inpatient Hospital Stay (HOSPITAL_COMMUNITY)
Admission: AD | Admit: 2013-04-15 | Discharge: 2013-04-15 | Disposition: A | Discharge status: Home / Self Care | Payer: Medicaid Other | Source: Ambulatory Visit | Attending: Obstetrics & Gynecology | Admitting: Obstetrics & Gynecology

## 2013-04-15 ENCOUNTER — Encounter (HOSPITAL_COMMUNITY): Payer: Self-pay | Admitting: *Deleted

## 2013-04-15 DIAGNOSIS — O468X1 Other antepartum hemorrhage, first trimester: Secondary | ICD-10-CM

## 2013-04-15 DIAGNOSIS — O209 Hemorrhage in early pregnancy, unspecified: Secondary | ICD-10-CM

## 2013-04-15 DIAGNOSIS — O43899 Other placental disorders, unspecified trimester: Secondary | ICD-10-CM

## 2013-04-15 DIAGNOSIS — O418X10 Other specified disorders of amniotic fluid and membranes, first trimester, not applicable or unspecified: Secondary | ICD-10-CM

## 2013-04-15 LAB — URINALYSIS, ROUTINE W REFLEX MICROSCOPIC
Bilirubin Urine: NEGATIVE
Nitrite: NEGATIVE
Protein, ur: NEGATIVE mg/dL
Specific Gravity, Urine: 1.015 (ref 1.005–1.030)
Urobilinogen, UA: 0.2 mg/dL (ref 0.0–1.0)

## 2013-04-15 LAB — URINE MICROSCOPIC-ADD ON

## 2013-04-15 NOTE — MAU Note (Signed)
Pt reports she started having some vaginal bleeding and a clots that started yesterday. No pain. Had intercourse on Thursday night. Dx with small subchorionic hemmoraghe  on her last visit.

## 2013-04-15 NOTE — MAU Provider Note (Signed)
History     CSN: [id number]  Arrival date and time: 04/15/13 8657   First Provider Initiated Contact with Patient 04/15/13 [phone]      Chief Complaint  Patient presents with  . Vaginal Bleeding   HPI [name] is a 27 y.o. G3P0020 at [redacted]w[redacted]d who presents to MAU today with complaint of vaginal bleeding since 1900 yesterday. She states that she was having light bleeding and sometimes a bit heavier with clots. She denies other vaginal discharge, abdomnial pain or fever. She had intercourse on Thursday. She has a known subchorionic hemorrhage from a previous visit in MAU.    OB History   Grav Para Term Preterm Abortions TAB SAB Ect Mult Living   3    2 2           Past Medical History  Diagnosis Date  . Urinary tract infection   . Medical history non-contributory     Past Surgical History  Procedure Laterality Date  . Dilation and curettage of uterus      No family history on file.  History  Substance Use Topics  . Smoking status: Former Smoker    Quit date: 03/16/2013  . Smokeless tobacco: Never Used  . Alcohol Use: No     Comment: occ    Allergies: No Known Allergies  Prescriptions prior to admission  Medication Sig Dispense Refill  . cephALEXin (KEFLEX) 500 MG capsule Take 1 capsule (500 mg total) by mouth 3 (three) times daily.  21 capsule  0  . metroNIDAZOLE (FLAGYL) 500 MG tablet Take 1 tablet (500 mg total) by mouth 2 (two) times daily.  14 tablet  0  . Prenatal Vit-Fe Fumarate-FA (PRENATAL MULTIVITAMIN) TABS Take 1 tablet by mouth daily at 12 noon.        Review of Systems  Constitutional: Negative for fever and malaise/fatigue.  Gastrointestinal: Negative for abdominal pain.  Genitourinary: Negative for dysuria, urgency and frequency.       + vaginal bleeding Neg - vaginal discharge  Neurological: Negative for dizziness, loss of consciousness and weakness.   Physical Exam   Blood pressure 121/66, pulse 70, temperature 98.6 F (37 C),  temperature source Oral, resp. rate 18, height 5' 1.5" (1.562 m), weight 96 lb (43.545 kg), last menstrual period 01/21/2013.  Physical Exam  Constitutional: She is oriented to person, place, and time. She appears well-developed and well-nourished. No distress.  HENT:  Head: Normocephalic and atraumatic.  Cardiovascular: Normal rate, regular rhythm and normal heart sounds.   Respiratory: Effort normal and breath sounds normal. No respiratory distress.  GI: Soft. Bowel sounds are normal. She exhibits no distension and no mass. There is no tenderness. There is no rebound and no guarding.  Genitourinary: Uterus is enlarged (appropriate for GA). Uterus is not tender. Cervix exhibits friability. Cervix exhibits no motion tenderness and no discharge. Right adnexum displays no mass and no tenderness. Left adnexum displays no mass and no tenderness. There is bleeding (scant blood in vaginal vault) around the vagina. Vaginal discharge (small amount of thin, white, mucus discharge noted) found.  Neurological: She is alert and oriented to person, place, and time.  Skin: Skin is warm and dry. No erythema.  Psychiatric: She has a normal mood and affect.    MAU Course  Procedures None  MDM +FHTs Minimal bleeding appears to be resolving. Bleeding onset most likely due to intercourse and known subchorionic hemorrhage  Assessment and Plan  A: Subchorionic hemorrhage, first trimester Vaginal bleeding in  early pregnancy  P: Discharge home Pelvic rest advised until bleeding has stopped or patient has OB visit Bleeding precautions and first trimester warning signs reviewed Patient encouraged to keep appointment to start prenatal care with [facility] clinic as scheduled Patient may return to MAU as needed or if her condition were to change or worsen  Freddi Starr, PA-C  04/15/2013, 8:19 AM

## 2013-04-15 NOTE — MAU Provider Note (Signed)
Attestation of Attending Supervision of Advanced Practitioner (CNM/NP): Evaluation and management procedures were performed by the Advanced Practitioner under my supervision and collaboration.  I have reviewed the Advanced Practitioner's note and chart, and I agree with the management and plan.  Kynli Chou 04/15/2013 12:41 PM

## 2013-04-16 ENCOUNTER — Other Ambulatory Visit: Payer: Self-pay | Admitting: Family

## 2013-04-16 LAB — URINE CULTURE
Colony Count: NO GROWTH
Culture: NO GROWTH

## 2013-04-25 ENCOUNTER — Ambulatory Visit (INDEPENDENT_AMBULATORY_CARE_PROVIDER_SITE_OTHER): Payer: Medicaid Other | Admitting: General Practice

## 2013-04-25 ENCOUNTER — Encounter (HOSPITAL_COMMUNITY): Payer: Self-pay | Admitting: Obstetrics and Gynecology

## 2013-04-25 VITALS — BP 103/72 | Temp 97.7°F | Wt 97.6 lb

## 2013-04-25 DIAGNOSIS — Z3490 Encounter for supervision of normal pregnancy, unspecified, unspecified trimester: Secondary | ICD-10-CM

## 2013-04-25 DIAGNOSIS — Z3480 Encounter for supervision of other normal pregnancy, unspecified trimester: Secondary | ICD-10-CM

## 2013-04-25 LAB — POCT URINALYSIS DIP (DEVICE)
Glucose, UA: NEGATIVE mg/dL
Protein, ur: NEGATIVE mg/dL
Specific Gravity, Urine: 1.015 (ref 1.005–1.030)
Urobilinogen, UA: 0.2 mg/dL (ref 0.0–1.0)

## 2013-04-25 NOTE — Progress Notes (Signed)
Pulse- 87 Patient reports daily headaches since getting pregnant and dizziness as well; does have some spotting when wiping, was told she has a subchronic hemorrhage

## 2013-04-25 NOTE — Addendum Note (Signed)
Addended by: Kathee Delton on: 04/25/2013 02:34 PM   Modules accepted: Orders

## 2013-04-26 LAB — OBSTETRIC PANEL
Basophils Absolute: 0 10*3/uL (ref 0.0–0.1)
Basophils Relative: 0 % (ref 0–1)
Eosinophils Absolute: 0.1 10*3/uL (ref 0.0–0.7)
Hemoglobin: 10.9 g/dL — ABNORMAL LOW (ref 12.0–15.0)
Hepatitis B Surface Ag: NEGATIVE
MCH: 27.5 pg (ref 26.0–34.0)
MCHC: 35.3 g/dL (ref 30.0–36.0)
Neutro Abs: 5.4 10*3/uL (ref 1.7–7.7)
Neutrophils Relative %: 70 % (ref 43–77)
Platelets: 223 10*3/uL (ref 150–400)
RDW: 13.6 % (ref 11.5–15.5)
Rh Type: POSITIVE

## 2013-04-27 ENCOUNTER — Other Ambulatory Visit: Payer: Self-pay | Admitting: Obstetrics and Gynecology

## 2013-04-27 DIAGNOSIS — Z3682 Encounter for antenatal screening for nuchal translucency: Secondary | ICD-10-CM

## 2013-04-27 LAB — HEMOGLOBINOPATHY EVALUATION
Hgb A: 60.2 % — ABNORMAL LOW (ref 96.8–97.8)
Hgb F Quant: 0 % (ref 0.0–2.0)
Hgb S Quant: 36.3 % — ABNORMAL HIGH

## 2013-04-28 ENCOUNTER — Other Ambulatory Visit: Payer: Self-pay | Admitting: Family

## 2013-04-28 DIAGNOSIS — Z3682 Encounter for antenatal screening for nuchal translucency: Secondary | ICD-10-CM

## 2013-05-01 ENCOUNTER — Ambulatory Visit (HOSPITAL_COMMUNITY): Payer: Medicaid Other | Attending: General Practice

## 2013-05-01 ENCOUNTER — Other Ambulatory Visit (HOSPITAL_COMMUNITY): Payer: Medicaid Other

## 2013-05-17 ENCOUNTER — Other Ambulatory Visit (HOSPITAL_COMMUNITY)
Admission: RE | Admit: 2013-05-17 | Discharge: 2013-05-17 | Disposition: A | Discharge status: Home / Self Care | Payer: Medicaid Other | Source: Ambulatory Visit | Attending: Obstetrics and Gynecology | Admitting: Obstetrics and Gynecology

## 2013-05-17 ENCOUNTER — Encounter: Payer: Self-pay | Admitting: Obstetrics and Gynecology

## 2013-05-17 ENCOUNTER — Ambulatory Visit (INDEPENDENT_AMBULATORY_CARE_PROVIDER_SITE_OTHER): Payer: Medicaid Other | Admitting: Obstetrics and Gynecology

## 2013-05-17 VITALS — BP 107/65 | Wt 102.5 lb

## 2013-05-17 DIAGNOSIS — Z113 Encounter for screening for infections with a predominantly sexual mode of transmission: Secondary | ICD-10-CM | POA: Insufficient documentation

## 2013-05-17 DIAGNOSIS — O209 Hemorrhage in early pregnancy, unspecified: Secondary | ICD-10-CM | POA: Insufficient documentation

## 2013-05-17 DIAGNOSIS — Z01419 Encounter for gynecological examination (general) (routine) without abnormal findings: Secondary | ICD-10-CM | POA: Insufficient documentation

## 2013-05-17 DIAGNOSIS — Z3492 Encounter for supervision of normal pregnancy, unspecified, second trimester: Secondary | ICD-10-CM

## 2013-05-17 DIAGNOSIS — D573 Sickle-cell trait: Secondary | ICD-10-CM

## 2013-05-17 DIAGNOSIS — O2390 Unspecified genitourinary tract infection in pregnancy, unspecified trimester: Secondary | ICD-10-CM

## 2013-05-17 DIAGNOSIS — B951 Streptococcus, group B, as the cause of diseases classified elsewhere: Secondary | ICD-10-CM

## 2013-05-17 LAB — POCT URINALYSIS DIP (DEVICE)
Bilirubin Urine: NEGATIVE
Nitrite: NEGATIVE
pH: 5.5 (ref 5.0–8.0)

## 2013-05-17 NOTE — Progress Notes (Signed)
Pulse: 78  Has some spotting for time to time.

## 2013-05-17 NOTE — Patient Instructions (Signed)
Pregnancy - Second Trimester The second trimester of pregnancy (3 to 6 months) is a period of rapid growth for you and your baby. At the end of the sixth month, your baby is about 9 inches long and weighs 1 1/2 pounds. You will begin to feel the baby move between 18 and 20 weeks of the pregnancy. This is called quickening. Weight gain is faster. A clear fluid (colostrum) may leak out of your breasts. You may feel small contractions of the womb (uterus). This is known as false labor or Braxton-Hicks contractions. This is like a practice for labor when the baby is ready to be born. Usually, the problems with morning sickness have usually passed by the end of your first trimester. Some women develop small dark blotches (called cholasma, mask of pregnancy) on their face that usually goes away after the baby is born. Exposure to the sun makes the blotches worse. Acne may also develop in some pregnant women and pregnant women who have acne, may find that it goes away. PRENATAL EXAMS  Blood work may continue to be done during prenatal exams. These tests are done to check on your health and the probable health of your baby. Blood work is used to follow your blood levels (hemoglobin). Anemia (low hemoglobin) is common during pregnancy. Iron and vitamins are given to help prevent this. You will also be checked for diabetes between 24 and 28 weeks of the pregnancy. Some of the previous blood tests may be repeated.  The size of the uterus is measured during each visit. This is to make sure that the baby is continuing to grow properly according to the dates of the pregnancy.  Your blood pressure is checked every prenatal visit. This is to make sure you are not getting toxemia.  Your urine is checked to make sure you do not have an infection, diabetes or protein in the urine.  Your weight is checked often to make sure gains are happening at the suggested rate. This is to ensure that both you and your baby are  growing normally.  Sometimes, an ultrasound is performed to confirm the proper growth and development of the baby. This is a test which bounces harmless sound waves off the baby so your caregiver can more accurately determine due dates. Sometimes, a test is done on the amniotic fluid surrounding the baby. This test is called an amniocentesis. The amniotic fluid is obtained by sticking a needle into the belly (abdomen). This is done to check the chromosomes in instances where there is a concern about possible genetic problems with the baby. It is also sometimes done near the end of pregnancy if an early delivery is required. In this case, it is done to help make sure the baby's lungs are mature enough for the baby to live outside of the womb. CHANGES OCCURING IN THE SECOND TRIMESTER OF PREGNANCY Your body goes through many changes during pregnancy. They vary from person to person. Talk to your caregiver about changes you notice that you are concerned about.  During the second trimester, you will likely have an increase in your appetite. It is normal to have cravings for certain foods. This varies from person to person and pregnancy to pregnancy.  Your lower abdomen will begin to bulge.  You may have to urinate more often because the uterus and baby are pressing on your bladder. It is also common to get more bladder infections during pregnancy. You can help this by drinking lots of fluids   and emptying your bladder before and after intercourse.  You may begin to get stretch marks on your hips, abdomen, and breasts. These are normal changes in the body during pregnancy. There are no exercises or medicines to take that prevent this change.  You may begin to develop swollen and bulging veins (varicose veins) in your legs. Wearing support hose, elevating your feet for 15 minutes, 3 to 4 times a day and limiting salt in your diet helps lessen the problem.  Heartburn may develop as the uterus grows and  pushes up against the stomach. Antacids recommended by your caregiver helps with this problem. Also, eating smaller meals 4 to 5 times a day helps.  Constipation can be treated with a stool softener or adding bulk to your diet. Drinking lots of fluids, and eating vegetables, fruits, and whole grains are helpful.  Exercising is also helpful. If you have been very active up until your pregnancy, most of these activities can be continued during your pregnancy. If you have been less active, it is helpful to start an exercise program such as walking.  Hemorrhoids may develop at the end of the second trimester. Warm sitz baths and hemorrhoid cream recommended by your caregiver helps hemorrhoid problems.  Backaches may develop during this time of your pregnancy. Avoid heavy lifting, wear low heal shoes, and practice good posture to help with backache problems.  Some pregnant women develop tingling and numbness of their hand and fingers because of swelling and tightening of ligaments in the wrist (carpel tunnel syndrome). This goes away after the baby is born.  As your breasts enlarge, you may have to get a bigger bra. Get a comfortable, cotton, support bra. Do not get a nursing bra until the last month of the pregnancy if you will be nursing the baby.  You may get a dark line from your belly button to the pubic area called the linea nigra.  You may develop rosy cheeks because of increase blood flow to the face.  You may develop spider looking lines of the face, neck, arms, and chest. These go away after the baby is born. HOME CARE INSTRUCTIONS   It is extremely important to avoid all smoking, herbs, alcohol, and unprescribed drugs during your pregnancy. These chemicals affect the formation and growth of the baby. Avoid these chemicals throughout the pregnancy to ensure the delivery of a healthy infant.  Most of your home care instructions are the same as suggested for the first trimester of your  pregnancy. Keep your caregiver's appointments. Follow your caregiver's instructions regarding medicine use, exercise, and diet.  During pregnancy, you are providing food for you and your baby. Continue to eat regular, well-balanced meals. Choose foods such as meat, fish, milk and other low fat dairy products, vegetables, fruits, and whole-grain breads and cereals. Your caregiver will tell you of the ideal weight gain.  A physical sexual relationship may be continued up until near the end of pregnancy if there are no other problems. Problems could include early (premature) leaking of amniotic fluid from the membranes, vaginal bleeding, abdominal pain, or other medical or pregnancy problems.  Exercise regularly if there are no restrictions. Check with your caregiver if you are unsure of the safety of some of your exercises. The greatest weight gain will occur in the last 2 trimesters of pregnancy. Exercise will help you:  Control your weight.  Get you in shape for labor and delivery.  Lose weight after you have the baby.  Wear   a good support or jogging bra for breast tenderness during pregnancy. This may help if worn during sleep. Pads or tissues may be used in the bra if you are leaking colostrum.  Do not use hot tubs, steam rooms or saunas throughout the pregnancy.  Wear your seat belt at all times when driving. This protects you and your baby if you are in an accident.  Avoid raw meat, uncooked cheese, cat litter boxes, and soil used by cats. These carry germs that can cause birth defects in the baby.  The second trimester is also a good time to visit your dentist for your dental health if this has not been done yet. Getting your teeth cleaned is okay. Use a soft toothbrush. Brush gently during pregnancy.  It is easier to leak urine during pregnancy. Tightening up and strengthening the pelvic muscles will help with this problem. Practice stopping your urination while you are going to the  bathroom. These are the same muscles you need to strengthen. It is also the muscles you would use as if you were trying to stop from passing gas. You can practice tightening these muscles up 10 times a set and repeating this about 3 times per day. Once you know what muscles to tighten up, do not perform these exercises during urination. It is more likely to contribute to an infection by backing up the urine.  Ask for help if you have financial, counseling, or nutritional needs during pregnancy. Your caregiver will be able to offer counseling for these needs as well as refer you for other special needs.  Your skin may become oily. If so, wash your face with mild soap, use non-greasy moisturizer and oil or cream based makeup. MEDICINES AND DRUG USE IN PREGNANCY  Take prenatal vitamins as directed. The vitamin should contain 1 milligram of folic acid. Keep all vitamins out of reach of children. Only a couple vitamins or tablets containing iron may be fatal to a baby or young child when ingested.  Avoid use of all medicines, including herbs, over-the-counter medicines, not prescribed or suggested by your caregiver. Only take over-the-counter or prescription medicines for pain, discomfort, or fever as directed by your caregiver. Do not use aspirin.  Let your caregiver also know about herbs you may be using.  Alcohol is related to a number of birth defects. This includes fetal alcohol syndrome. All alcohol, in any form, should be avoided completely. Smoking will cause low birth rate and premature babies.  Street or illegal drugs are very harmful to the baby. They are absolutely forbidden. A baby born to an addicted mother will be addicted at birth. The baby will go through the same withdrawal an adult does. SEEK MEDICAL CARE IF:  You have any concerns or worries during your pregnancy. It is better to call with your questions if you feel they cannot wait, rather than worry about them. SEEK IMMEDIATE  MEDICAL CARE IF:   An unexplained oral temperature above 102 F (38.9 C) develops, or as your caregiver suggests.  You have leaking of fluid from the vagina (birth canal). If leaking membranes are suspected, take your temperature and tell your caregiver of this when you call.  There is vaginal spotting, bleeding, or passing clots. Tell your caregiver of the amount and how many pads are used. Light spotting in pregnancy is common, especially following intercourse.  You develop a bad smelling vaginal discharge with a change in the color from clear to white.  You continue to feel   sick to your stomach (nauseated) and have no relief from remedies suggested. You vomit blood or coffee ground-like materials.  You lose more than 2 pounds of weight or gain more than 2 pounds of weight over 1 week, or as suggested by your caregiver.  You notice swelling of your face, hands, feet, or legs.  You get exposed to German measles and have never had them.  You are exposed to fifth disease or chickenpox.  You develop belly (abdominal) pain. Round ligament discomfort is a common non-cancerous (benign) cause of abdominal pain in pregnancy. Your caregiver still must evaluate you.  You develop a bad headache that does not go away.  You develop fever, diarrhea, pain with urination, or shortness of breath.  You develop visual problems, blurry, or double vision.  You fall or are in a car accident or any kind of trauma.  There is mental or physical violence at home. Document Released: 09/08/2001 Document Revised: 06/08/2012 Document Reviewed: 03/13/2009 [employer] Patient Information 2014 [employer].  

## 2013-05-17 NOTE — Progress Notes (Signed)
   Subjective:    Mariah Wallace is a G3P0020 [redacted]w[redacted]d being seen today for her first obstetrical visit.  Her obstetrical history is significant for early EAB x 2.. Patient does intend to breast feed. Pregnancy history fully reviewed.  Patient reports bleeding.Now brown staining only. Dx with [facility] when seen in MAU. GC/CT neg. No irritative vaginitis sx.   Filed Vitals:   05/17/13 0954  BP: 107/65  Weight: 102 lb 8 oz (46.494 kg)    HISTORY: OB History  Gravida Para Term Preterm AB SAB TAB Ectopic Multiple Living  3 0 0 0 2 0 2 0 0 0     # Outcome Date GA Lbr Len/2nd Weight Sex Delivery Anes PTL Lv  3 CUR           2 TAB           1 TAB              Past Medical History  Diagnosis Date  . Urinary tract infection   . Medical history non-contributory    Past Surgical History  Procedure Laterality Date  . Dilation and curettage of uterus     Family History  Problem Relation Age of Onset  . Hypertension Mother   . Cancer Paternal Uncle      Exam    Uterus:  Fundal Height: 16 cm  Pelvic Exam:    Perineum: Normal Perineum   Vulva: normal, Bartholin's, Urethra, Skene's normal, female escutcheon   Vagina:  normal mucosa, scant blood brown       Cervix: no lesions, nulliparous appearance and no inflammation or bleeding from Pap   Adnexa: no mass, fullness, tenderness   Bony Pelvis: average  System: Breast:  normal appearance, no masses or tenderness   Skin: normal coloration and turgor, no rashes    Neurologic: oriented, normal, grossly non-focal   Extremities: normal strength, tone, and muscle mass   HEENT PERRLA and extra ocular movement intact   Mouth/Teeth mucous membranes moist, pharynx normal without lesions and dental hygiene good   Neck supple and no masses   Cardiovascular: regular rate and rhythm, no murmurs or gallops   Respiratory:  appears well, vitals normal, no respiratory distress, acyanotic, normal RR, ear and throat exam is normal, neck free of mass  or lymphadenopathy, chest clear, no wheezing, crepitations, rhonchi, normal symmetric air entry   Abdomen: soft, flat, NT. S=D    Urinary: urethral meatus normal      Assessment:    Pregnancy: [id number] Patient Active Problem List   Diagnosis Date Noted  . Sickle cell trait 05/17/2013  . First trimester bleeding 05/17/2013  . Supervision of normal pregnancy in second trimester 05/17/2013  . GBS (group B streptococcus) UTI complicating pregnancy 04/03/2013        Plan:     Initial labs drawn. Prenatal vitamins. Problem list reviewed and updated. Genetic Screening discussed Quad Screen: ordered.  Ultrasound discussed; fetal survey: requested.  Follow up in 4 weeks. 50% of 30 min visit spent on counseling and coordination of care.  Pap done. Will confirm FOB sickle status   Mariah Wallace 05/17/2013

## 2013-05-17 NOTE — Addendum Note (Signed)
Addended by: Faythe Casa on: 05/17/2013 03:19 PM   Modules accepted: Orders

## 2013-05-26 ENCOUNTER — Encounter: Payer: Self-pay | Admitting: *Deleted

## 2013-05-26 DIAGNOSIS — Z3492 Encounter for supervision of normal pregnancy, unspecified, second trimester: Secondary | ICD-10-CM

## 2013-06-14 ENCOUNTER — Ambulatory Visit (HOSPITAL_COMMUNITY)
Admission: RE | Admit: 2013-06-14 | Discharge: 2013-06-14 | Disposition: A | Discharge status: Home / Self Care | Payer: Medicaid Other | Source: Ambulatory Visit | Attending: Obstetrics and Gynecology | Admitting: Obstetrics and Gynecology

## 2013-06-14 ENCOUNTER — Other Ambulatory Visit: Payer: Self-pay | Admitting: Obstetrics and Gynecology

## 2013-06-14 ENCOUNTER — Ambulatory Visit (INDEPENDENT_AMBULATORY_CARE_PROVIDER_SITE_OTHER): Payer: Medicaid Other | Admitting: Advanced Practice Midwife

## 2013-06-14 VITALS — BP 104/75 | Temp 98.3°F | Wt 109.0 lb

## 2013-06-14 DIAGNOSIS — D573 Sickle-cell trait: Secondary | ICD-10-CM

## 2013-06-14 DIAGNOSIS — Z3492 Encounter for supervision of normal pregnancy, unspecified, second trimester: Secondary | ICD-10-CM

## 2013-06-14 DIAGNOSIS — O209 Hemorrhage in early pregnancy, unspecified: Secondary | ICD-10-CM

## 2013-06-14 DIAGNOSIS — D571 Sickle-cell disease without crisis: Secondary | ICD-10-CM | POA: Insufficient documentation

## 2013-06-14 DIAGNOSIS — B951 Streptococcus, group B, as the cause of diseases classified elsewhere: Secondary | ICD-10-CM

## 2013-06-14 DIAGNOSIS — O99019 Anemia complicating pregnancy, unspecified trimester: Secondary | ICD-10-CM | POA: Insufficient documentation

## 2013-06-14 DIAGNOSIS — O2390 Unspecified genitourinary tract infection in pregnancy, unspecified trimester: Secondary | ICD-10-CM

## 2013-06-14 DIAGNOSIS — Z3689 Encounter for other specified antenatal screening: Secondary | ICD-10-CM | POA: Insufficient documentation

## 2013-06-14 LAB — POCT URINALYSIS DIP (DEVICE)
Glucose, UA: NEGATIVE mg/dL
Hgb urine dipstick: NEGATIVE
Nitrite: NEGATIVE
Protein, ur: NEGATIVE mg/dL
Urobilinogen, UA: 0.2 mg/dL (ref 0.0–1.0)

## 2013-06-14 NOTE — Patient Instructions (Signed)
Sickle Cell Anemia  Sickle cell anemia needs regular medical care by your caregiver and awareness by you when to seek medical care. Pain is a common problem in children with sickle cell disease. This usually starts at less than 26 year of age. Pain can occur nearly anywhere in the body, but most commonly happens in the extremities, back, chest, or belly (abdomen). Pain episodes can start suddenly or may follow an illness. These attacks can appear as decreased activity, loss of appetite, change in behavior, or simply complaints of pain.  DIAGNOSIS    Specialized blood and gene testing can help make this diagnosis early in the disease. Blood tests may then be done to watch blood levels.   Specialized brain scans are done when there are problems in the brain during a crisis.   Lung testing may be done later in the disease.  HOME CARE INSTRUCTIONS    Maintain good hydration. Increase your child's fluid intake in hot weather and during exercise.   Avoid smoking around your child. Smoking lowers the oxygen in the blood and can cause sickling.   Control pain. Only give your child over-the-counter or prescription medicines for pain, discomfort, or fever as directed by their caregiver. Do not give aspirin to children because of the association with Reye's syndrome.   Keep regular health care checks to keep a proper red blood cell (hemoglobin) level. A moderate anemia level protects against sickling crises.   You or your child should receive all the same immunizations and care as the people around them.   Moms should breastfeed their babies if possible. Use formulas with iron added if breastfeeding is not possible. Additional iron should not be given unless there is a lack of it. People with SCD build up iron faster than normal. Give folic acid and additional vitamins as directed.   If you or your child has been prescribed antibiotics or other medications to prevent problems, give them as directed.   Summer camps  are available for children with SCD. They may help young people deal with their disease. The camps introduce them to other children with the same condition.   Young people with SCD may become frustrated or angry at their disease. This can cause rebellion and refusal to follow medical care. Help groups or counseling may help with these problems.   Make sure your child wears a medical alert bracelet. When traveling, keep your medical information, caregiver's names, and the medications your child takes with you at all times.  SEEK IMMEDIATE MEDICAL CARE IF:    You or your child feels dizzy or faint.   You or your child develops a new onset of abdominal pain, especially on the left side near the stomach area.   You or your child develops a persistent, often uncomfortable and painful penile erection. This is called priapism. Always check young boys for this. It is often embarrassing for them and they may not bring it to your attention. This is a medical emergency and needs immediate treatment. If this is not treated it will lead to impotence.   You or your child develops numbness in or has a hard time moving arms and legs.   You or your child has a hard time with speech.   You have a fever.   You or your child develops signs of infection (chills, lethargy, irritability, poor eating, vomiting). The younger the child, the more you should be concerned.   With fevers, do not give medications to lower   the fever right away. This could cover up a problem that is developing. Notify your caregiver.   You or your child develops pain that is not helped with medicine.   You or your child develops shortness of breath, or is coughing up pus-like or bloody sputum.   You or your child develops any problems that are new and are causing you to worry.  Document Released: 12/23/2005 Document Revised: 12/07/2011 Document Reviewed: 02/12/2010  [employer] Patient Information 2014 [employer].

## 2013-06-14 NOTE — Progress Notes (Signed)
Pulse: 73

## 2013-06-14 NOTE — Progress Notes (Signed)
Mariah Wallace  was seen today for an ultrasound appointment.  See full report in [facility].  Impression: Single IUP at 18 4/7 weeks Thickened NT- low risk for aneuploidy by NIPS Normal fetal anatomic survey  Limited views of the fetal heart and face were obtained due to fetal position  Normal amniotic fluid volume  Recommendations: Recommend follow-up ultrasound examination in 6 weeks to reevaluate the fetal heart and face Fetal echo (scheduled)  Alpha Gula, MD

## 2013-06-14 NOTE — Progress Notes (Signed)
C/O bilat. Sciatic pain, remedies discussed. Has some headaches but does not want to take meds. May use caffeine, food, and ice packs to neck or peppermint oil behind ear. FOB never actually tested for [place].  Will refer to [facility] for testing, discussed need to do so

## 2013-06-21 NOTE — Addendum Note (Signed)
Encounter addended by: Danae Orleans, CNM on: 06/21/2013  6:06 PM<BR>     Documentation filed: Problem List

## 2013-07-12 ENCOUNTER — Ambulatory Visit (INDEPENDENT_AMBULATORY_CARE_PROVIDER_SITE_OTHER): Payer: Medicaid Other | Admitting: Obstetrics and Gynecology

## 2013-07-12 VITALS — BP 109/57 | Temp 98.4°F | Wt 117.7 lb

## 2013-07-12 DIAGNOSIS — O2390 Unspecified genitourinary tract infection in pregnancy, unspecified trimester: Secondary | ICD-10-CM

## 2013-07-12 DIAGNOSIS — B951 Streptococcus, group B, as the cause of diseases classified elsewhere: Secondary | ICD-10-CM

## 2013-07-12 LAB — POCT URINALYSIS DIP (DEVICE)
Bilirubin Urine: NEGATIVE
Nitrite: NEGATIVE
Protein, ur: NEGATIVE mg/dL
Urobilinogen, UA: 0.2 mg/dL (ref 0.0–1.0)
pH: 6.5 (ref 5.0–8.0)

## 2013-07-12 MED ORDER — FOLIC ACID 1 MG PO TABS: 1.0000 mg | ORAL_TABLET | Freq: Every day | ORAL | Status: DC

## 2013-07-12 NOTE — Progress Notes (Signed)
Pulse: 72 Needs refill for PNV

## 2013-07-12 NOTE — Progress Notes (Signed)
Can't swallow PNVs: Rx folate 1mg  and take OTC PN vit with iron. Back pain better. No further spotting.

## 2013-07-12 NOTE — Patient Instructions (Signed)
Pregnancy - Second Trimester The second trimester of pregnancy (3 to 6 months) is a period of rapid growth for you and your baby. At the end of the sixth month, your baby is about 9 inches long and weighs 1 1/2 pounds. You will begin to feel the baby move between 18 and 20 weeks of the pregnancy. This is called quickening. Weight gain is faster. A clear fluid (colostrum) may leak out of your breasts. You may feel small contractions of the womb (uterus). This is known as false labor or Braxton-Hicks contractions. This is like a practice for labor when the baby is ready to be born. Usually, the problems with morning sickness have usually passed by the end of your first trimester. Some women develop small dark blotches (called cholasma, mask of pregnancy) on their face that usually goes away after the baby is born. Exposure to the sun makes the blotches worse. Acne may also develop in some pregnant women and pregnant women who have acne, may find that it goes away. PRENATAL EXAMS  Blood work may continue to be done during prenatal exams. These tests are done to check on your health and the probable health of your baby. Blood work is used to follow your blood levels (hemoglobin). Anemia (low hemoglobin) is common during pregnancy. Iron and vitamins are given to help prevent this. You will also be checked for diabetes between 24 and 28 weeks of the pregnancy. Some of the previous blood tests may be repeated.  The size of the uterus is measured during each visit. This is to make sure that the baby is continuing to grow properly according to the dates of the pregnancy.  Your blood pressure is checked every prenatal visit. This is to make sure you are not getting toxemia.  Your urine is checked to make sure you do not have an infection, diabetes or protein in the urine.  Your weight is checked often to make sure gains are happening at the suggested rate. This is to ensure that both you and your baby are  growing normally.  Sometimes, an ultrasound is performed to confirm the proper growth and development of the baby. This is a test which bounces harmless sound waves off the baby so your caregiver can more accurately determine due dates. Sometimes, a test is done on the amniotic fluid surrounding the baby. This test is called an amniocentesis. The amniotic fluid is obtained by sticking a needle into the belly (abdomen). This is done to check the chromosomes in instances where there is a concern about possible genetic problems with the baby. It is also sometimes done near the end of pregnancy if an early delivery is required. In this case, it is done to help make sure the baby's lungs are mature enough for the baby to live outside of the womb. CHANGES OCCURING IN THE SECOND TRIMESTER OF PREGNANCY Your body goes through many changes during pregnancy. They vary from person to person. Talk to your caregiver about changes you notice that you are concerned about.  During the second trimester, you will likely have an increase in your appetite. It is normal to have cravings for certain foods. This varies from person to person and pregnancy to pregnancy.  Your lower abdomen will begin to bulge.  You may have to urinate more often because the uterus and baby are pressing on your bladder. It is also common to get more bladder infections during pregnancy. You can help this by drinking lots of fluids   and emptying your bladder before and after intercourse.  You may begin to get stretch marks on your hips, abdomen, and breasts. These are normal changes in the body during pregnancy. There are no exercises or medicines to take that prevent this change.  You may begin to develop swollen and bulging veins (varicose veins) in your legs. Wearing support hose, elevating your feet for 15 minutes, 3 to 4 times a day and limiting salt in your diet helps lessen the problem.  Heartburn may develop as the uterus grows and  pushes up against the stomach. Antacids recommended by your caregiver helps with this problem. Also, eating smaller meals 4 to 5 times a day helps.  Constipation can be treated with a stool softener or adding bulk to your diet. Drinking lots of fluids, and eating vegetables, fruits, and whole grains are helpful.  Exercising is also helpful. If you have been very active up until your pregnancy, most of these activities can be continued during your pregnancy. If you have been less active, it is helpful to start an exercise program such as walking.  Hemorrhoids may develop at the end of the second trimester. Warm sitz baths and hemorrhoid cream recommended by your caregiver helps hemorrhoid problems.  Backaches may develop during this time of your pregnancy. Avoid heavy lifting, wear low heal shoes, and practice good posture to help with backache problems.  Some pregnant women develop tingling and numbness of their hand and fingers because of swelling and tightening of ligaments in the wrist (carpel tunnel syndrome). This goes away after the baby is born.  As your breasts enlarge, you may have to get a bigger bra. Get a comfortable, cotton, support bra. Do not get a nursing bra until the last month of the pregnancy if you will be nursing the baby.  You may get a dark line from your belly button to the pubic area called the linea nigra.  You may develop rosy cheeks because of increase blood flow to the face.  You may develop spider looking lines of the face, neck, arms, and chest. These go away after the baby is born. HOME CARE INSTRUCTIONS   It is extremely important to avoid all smoking, herbs, alcohol, and unprescribed drugs during your pregnancy. These chemicals affect the formation and growth of the baby. Avoid these chemicals throughout the pregnancy to ensure the delivery of a healthy infant.  Most of your home care instructions are the same as suggested for the first trimester of your  pregnancy. Keep your caregiver's appointments. Follow your caregiver's instructions regarding medicine use, exercise, and diet.  During pregnancy, you are providing food for you and your baby. Continue to eat regular, well-balanced meals. Choose foods such as meat, fish, milk and other low fat dairy products, vegetables, fruits, and whole-grain breads and cereals. Your caregiver will tell you of the ideal weight gain.  A physical sexual relationship may be continued up until near the end of pregnancy if there are no other problems. Problems could include early (premature) leaking of amniotic fluid from the membranes, vaginal bleeding, abdominal pain, or other medical or pregnancy problems.  Exercise regularly if there are no restrictions. Check with your caregiver if you are unsure of the safety of some of your exercises. The greatest weight gain will occur in the last 2 trimesters of pregnancy. Exercise will help you:  Control your weight.  Get you in shape for labor and delivery.  Lose weight after you have the baby.  Wear   a good support or jogging bra for breast tenderness during pregnancy. This may help if worn during sleep. Pads or tissues may be used in the bra if you are leaking colostrum.  Do not use hot tubs, steam rooms or saunas throughout the pregnancy.  Wear your seat belt at all times when driving. This protects you and your baby if you are in an accident.  Avoid raw meat, uncooked cheese, cat litter boxes, and soil used by cats. These carry germs that can cause birth defects in the baby.  The second trimester is also a good time to visit your dentist for your dental health if this has not been done yet. Getting your teeth cleaned is okay. Use a soft toothbrush. Brush gently during pregnancy.  It is easier to leak urine during pregnancy. Tightening up and strengthening the pelvic muscles will help with this problem. Practice stopping your urination while you are going to the  bathroom. These are the same muscles you need to strengthen. It is also the muscles you would use as if you were trying to stop from passing gas. You can practice tightening these muscles up 10 times a set and repeating this about 3 times per day. Once you know what muscles to tighten up, do not perform these exercises during urination. It is more likely to contribute to an infection by backing up the urine.  Ask for help if you have financial, counseling, or nutritional needs during pregnancy. Your caregiver will be able to offer counseling for these needs as well as refer you for other special needs.  Your skin may become oily. If so, wash your face with mild soap, use non-greasy moisturizer and oil or cream based makeup. MEDICINES AND DRUG USE IN PREGNANCY  Take prenatal vitamins as directed. The vitamin should contain 1 milligram of folic acid. Keep all vitamins out of reach of children. Only a couple vitamins or tablets containing iron may be fatal to a baby or young child when ingested.  Avoid use of all medicines, including herbs, over-the-counter medicines, not prescribed or suggested by your caregiver. Only take over-the-counter or prescription medicines for pain, discomfort, or fever as directed by your caregiver. Do not use aspirin.  Let your caregiver also know about herbs you may be using.  Alcohol is related to a number of birth defects. This includes fetal alcohol syndrome. All alcohol, in any form, should be avoided completely. Smoking will cause low birth rate and premature babies.  Street or illegal drugs are very harmful to the baby. They are absolutely forbidden. A baby born to an addicted mother will be addicted at birth. The baby will go through the same withdrawal an adult does. SEEK MEDICAL CARE IF:  You have any concerns or worries during your pregnancy. It is better to call with your questions if you feel they cannot wait, rather than worry about them. SEEK IMMEDIATE  MEDICAL CARE IF:   An unexplained oral temperature above 102 F (38.9 C) develops, or as your caregiver suggests.  You have leaking of fluid from the vagina (birth canal). If leaking membranes are suspected, take your temperature and tell your caregiver of this when you call.  There is vaginal spotting, bleeding, or passing clots. Tell your caregiver of the amount and how many pads are used. Light spotting in pregnancy is common, especially following intercourse.  You develop a bad smelling vaginal discharge with a change in the color from clear to white.  You continue to feel   sick to your stomach (nauseated) and have no relief from remedies suggested. You vomit blood or coffee ground-like materials.  You lose more than 2 pounds of weight or gain more than 2 pounds of weight over 1 week, or as suggested by your caregiver.  You notice swelling of your face, hands, feet, or legs.  You get exposed to German measles and have never had them.  You are exposed to fifth disease or chickenpox.  You develop belly (abdominal) pain. Round ligament discomfort is a common non-cancerous (benign) cause of abdominal pain in pregnancy. Your caregiver still must evaluate you.  You develop a bad headache that does not go away.  You develop fever, diarrhea, pain with urination, or shortness of breath.  You develop visual problems, blurry, or double vision.  You fall or are in a car accident or any kind of trauma.  There is mental or physical violence at home. Document Released: 09/08/2001 Document Revised: 06/08/2012 Document Reviewed: 03/13/2009 [employer] Patient Information 2014 [employer].  

## 2013-07-19 ENCOUNTER — Encounter: Payer: Medicaid Other | Admitting: Family Medicine

## 2013-07-19 ENCOUNTER — Encounter: Payer: Self-pay | Admitting: Family Medicine

## 2013-07-26 ENCOUNTER — Ambulatory Visit (INDEPENDENT_AMBULATORY_CARE_PROVIDER_SITE_OTHER): Payer: Medicaid Other | Admitting: Family Medicine

## 2013-07-26 ENCOUNTER — Encounter: Payer: Self-pay | Admitting: Family Medicine

## 2013-07-26 VITALS — BP 100/67 | Temp 97.4°F | Wt 116.1 lb

## 2013-07-26 DIAGNOSIS — O2390 Unspecified genitourinary tract infection in pregnancy, unspecified trimester: Secondary | ICD-10-CM

## 2013-07-26 DIAGNOSIS — Z3492 Encounter for supervision of normal pregnancy, unspecified, second trimester: Secondary | ICD-10-CM

## 2013-07-26 DIAGNOSIS — R112 Nausea with vomiting, unspecified: Secondary | ICD-10-CM

## 2013-07-26 MED ORDER — ENSURE COMPLETE PO LIQD: 237.0000 mL | Freq: Two times a day (BID) | ORAL | Status: DC

## 2013-07-26 MED ORDER — ONDANSETRON 4 MG PO TBDP: 4.0000 mg | ORAL_TABLET | Freq: Three times a day (TID) | ORAL | Status: DC | PRN

## 2013-07-26 NOTE — Progress Notes (Signed)
Pulse- 64  Pressure-lower abd Declined flu vaccine

## 2013-07-26 NOTE — Patient Instructions (Signed)
Pregnancy - Second Trimester The second trimester of pregnancy (3 to 6 months) is a period of rapid growth for you and your baby. At the end of the sixth month, your baby is about 9 inches long and weighs 1 1/2 pounds. You will begin to feel the baby move between 18 and 20 weeks of the pregnancy. This is called quickening. Weight gain is faster. A clear fluid (colostrum) may leak out of your breasts. You may feel small contractions of the womb (uterus). This is known as false labor or Braxton-Hicks contractions. This is like a practice for labor when the baby is ready to be born. Usually, the problems with morning sickness have usually passed by the end of your first trimester. Some women develop small dark blotches (called cholasma, mask of pregnancy) on their face that usually goes away after the baby is born. Exposure to the sun makes the blotches worse. Acne may also develop in some pregnant women and pregnant women who have acne, may find that it goes away. PRENATAL EXAMS  Blood work may continue to be done during prenatal exams. These tests are done to check on your health and the probable health of your baby. Blood work is used to follow your blood levels (hemoglobin). Anemia (low hemoglobin) is common during pregnancy. Iron and vitamins are given to help prevent this. You will also be checked for diabetes between 24 and 28 weeks of the pregnancy. Some of the previous blood tests may be repeated.  The size of the uterus is measured during each visit. This is to make sure that the baby is continuing to grow properly according to the dates of the pregnancy.  Your blood pressure is checked every prenatal visit. This is to make sure you are not getting toxemia.  Your urine is checked to make sure you do not have an infection, diabetes or protein in the urine.  Your weight is checked often to make sure gains are happening at the suggested rate. This is to ensure that both you and your baby are  growing normally.  Sometimes, an ultrasound is performed to confirm the proper growth and development of the baby. This is a test which bounces harmless sound waves off the baby so your caregiver can more accurately determine due dates. Sometimes, a test is done on the amniotic fluid surrounding the baby. This test is called an amniocentesis. The amniotic fluid is obtained by sticking a needle into the belly (abdomen). This is done to check the chromosomes in instances where there is a concern about possible genetic problems with the baby. It is also sometimes done near the end of pregnancy if an early delivery is required. In this case, it is done to help make sure the baby's lungs are mature enough for the baby to live outside of the womb. CHANGES OCCURING IN THE SECOND TRIMESTER OF PREGNANCY Your body goes through many changes during pregnancy. They vary from person to person. Talk to your caregiver about changes you notice that you are concerned about.  During the second trimester, you will likely have an increase in your appetite. It is normal to have cravings for certain foods. This varies from person to person and pregnancy to pregnancy.  Your lower abdomen will begin to bulge.  You may have to urinate more often because the uterus and baby are pressing on your bladder. It is also common to get more bladder infections during pregnancy. You can help this by drinking lots of fluids   and emptying your bladder before and after intercourse.  You may begin to get stretch marks on your hips, abdomen, and breasts. These are normal changes in the body during pregnancy. There are no exercises or medicines to take that prevent this change.  You may begin to develop swollen and bulging veins (varicose veins) in your legs. Wearing support hose, elevating your feet for 15 minutes, 3 to 4 times a day and limiting salt in your diet helps lessen the problem.  Heartburn may develop as the uterus grows and  pushes up against the stomach. Antacids recommended by your caregiver helps with this problem. Also, eating smaller meals 4 to 5 times a day helps.  Constipation can be treated with a stool softener or adding bulk to your diet. Drinking lots of fluids, and eating vegetables, fruits, and whole grains are helpful.  Exercising is also helpful. If you have been very active up until your pregnancy, most of these activities can be continued during your pregnancy. If you have been less active, it is helpful to start an exercise program such as walking.  Hemorrhoids may develop at the end of the second trimester. Warm sitz baths and hemorrhoid cream recommended by your caregiver helps hemorrhoid problems.  Backaches may develop during this time of your pregnancy. Avoid heavy lifting, wear low heal shoes, and practice good posture to help with backache problems.  Some pregnant women develop tingling and numbness of their hand and fingers because of swelling and tightening of ligaments in the wrist (carpel tunnel syndrome). This goes away after the baby is born.  As your breasts enlarge, you may have to get a bigger bra. Get a comfortable, cotton, support bra. Do not get a nursing bra until the last month of the pregnancy if you will be nursing the baby.  You may get a dark line from your belly button to the pubic area called the linea nigra.  You may develop rosy cheeks because of increase blood flow to the face.  You may develop spider looking lines of the face, neck, arms, and chest. These go away after the baby is born. HOME CARE INSTRUCTIONS   It is extremely important to avoid all smoking, herbs, alcohol, and unprescribed drugs during your pregnancy. These chemicals affect the formation and growth of the baby. Avoid these chemicals throughout the pregnancy to ensure the delivery of a healthy infant.  Most of your home care instructions are the same as suggested for the first trimester of your  pregnancy. Keep your caregiver's appointments. Follow your caregiver's instructions regarding medicine use, exercise, and diet.  During pregnancy, you are providing food for you and your baby. Continue to eat regular, well-balanced meals. Choose foods such as meat, fish, milk and other low fat dairy products, vegetables, fruits, and whole-grain breads and cereals. Your caregiver will tell you of the ideal weight gain.  A physical sexual relationship may be continued up until near the end of pregnancy if there are no other problems. Problems could include early (premature) leaking of amniotic fluid from the membranes, vaginal bleeding, abdominal pain, or other medical or pregnancy problems.  Exercise regularly if there are no restrictions. Check with your caregiver if you are unsure of the safety of some of your exercises. The greatest weight gain will occur in the last 2 trimesters of pregnancy. Exercise will help you:  Control your weight.  Get you in shape for labor and delivery.  Lose weight after you have the baby.  Wear   a good support or jogging bra for breast tenderness during pregnancy. This may help if worn during sleep. Pads or tissues may be used in the bra if you are leaking colostrum.  Do not use hot tubs, steam rooms or saunas throughout the pregnancy.  Wear your seat belt at all times when driving. This protects you and your baby if you are in an accident.  Avoid raw meat, uncooked cheese, cat litter boxes, and soil used by cats. These carry germs that can cause birth defects in the baby.  The second trimester is also a good time to visit your dentist for your dental health if this has not been done yet. Getting your teeth cleaned is okay. Use a soft toothbrush. Brush gently during pregnancy.  It is easier to leak urine during pregnancy. Tightening up and strengthening the pelvic muscles will help with this problem. Practice stopping your urination while you are going to the  bathroom. These are the same muscles you need to strengthen. It is also the muscles you would use as if you were trying to stop from passing gas. You can practice tightening these muscles up 10 times a set and repeating this about 3 times per day. Once you know what muscles to tighten up, do not perform these exercises during urination. It is more likely to contribute to an infection by backing up the urine.  Ask for help if you have financial, counseling, or nutritional needs during pregnancy. Your caregiver will be able to offer counseling for these needs as well as refer you for other special needs.  Your skin may become oily. If so, wash your face with mild soap, use non-greasy moisturizer and oil or cream based makeup. MEDICINES AND DRUG USE IN PREGNANCY  Take prenatal vitamins as directed. The vitamin should contain 1 milligram of folic acid. Keep all vitamins out of reach of children. Only a couple vitamins or tablets containing iron may be fatal to a baby or young child when ingested.  Avoid use of all medicines, including herbs, over-the-counter medicines, not prescribed or suggested by your caregiver. Only take over-the-counter or prescription medicines for pain, discomfort, or fever as directed by your caregiver. Do not use aspirin.  Let your caregiver also know about herbs you may be using.  Alcohol is related to a number of birth defects. This includes fetal alcohol syndrome. All alcohol, in any form, should be avoided completely. Smoking will cause low birth rate and premature babies.  Street or illegal drugs are very harmful to the baby. They are absolutely forbidden. A baby born to an addicted mother will be addicted at birth. The baby will go through the same withdrawal an adult does. SEEK MEDICAL CARE IF:  You have any concerns or worries during your pregnancy. It is better to call with your questions if you feel they cannot wait, rather than worry about them. SEEK IMMEDIATE  MEDICAL CARE IF:   An unexplained oral temperature above 102 F (38.9 C) develops, or as your caregiver suggests.  You have leaking of fluid from the vagina (birth canal). If leaking membranes are suspected, take your temperature and tell your caregiver of this when you call.  There is vaginal spotting, bleeding, or passing clots. Tell your caregiver of the amount and how many pads are used. Light spotting in pregnancy is common, especially following intercourse.  You develop a bad smelling vaginal discharge with a change in the color from clear to white.  You continue to feel   sick to your stomach (nauseated) and have no relief from remedies suggested. You vomit blood or coffee ground-like materials.  You lose more than 2 pounds of weight or gain more than 2 pounds of weight over 1 week, or as suggested by your caregiver.  You notice swelling of your face, hands, feet, or legs.  You get exposed to German measles and have never had them.  You are exposed to fifth disease or chickenpox.  You develop belly (abdominal) pain. Round ligament discomfort is a common non-cancerous (benign) cause of abdominal pain in pregnancy. Your caregiver still must evaluate you.  You develop a bad headache that does not go away.  You develop fever, diarrhea, pain with urination, or shortness of breath.  You develop visual problems, blurry, or double vision.  You fall or are in a car accident or any kind of trauma.  There is mental or physical violence at home. Document Released: 09/08/2001 Document Revised: 06/08/2012 Document Reviewed: 03/13/2009 [employer] Patient Information 2014 [employer].  

## 2013-07-26 NOTE — Addendum Note (Signed)
Addended by: Jill Side on: 07/26/2013 08:41 AM   Modules accepted: Orders

## 2013-07-26 NOTE — Progress Notes (Signed)
No ctx, no VB, no LOF, normal FM Complains of persistent n/v   Difficult weight gain. Pt is having difficulty with keeping food down and has had decreased caloric intake. Encourage good intake. tx n/v with zofran 4mg  PRN.  Mariah Wallace is a 27 y.o. G3P0020 at [redacted]w[redacted]d here for ROB visit.  Discussed with Patient:  -Plans to breast feed.  All questions answered. -Continue prenatal vitamins. -  Reviewed genetics screen (Quad screen / first trimester screen / serum integrated screen / full integrated screen done/ not done).   -Reviewed fetal kick counts (Pt to perform daily at a time when the baby is active, lie laterally with both hands on belly in quiet room and count all movements (hiccups, shoulder rolls, obvious kicks, etc); pt is to report to clinic or MAU for less than 10 movements felt in a one hour time period-pt told as soon as she counts 10 movements the count is complete.)  - Routine precautions discussed (depression, infection s/s).   Patient provided with all pertinent phone numbers for emergencies. - RTC for any VB, regular, painful cramps/ctxs occurring at a rate of >2/10 min, fever (100.5 or higher), n/v/d, any pain that is unresolving or worsening, LOF, decreased fetal movement, CP, SOB, edema  Problems: Patient Active Problem List   Diagnosis Date Noted  . Sickle cell trait 05/17/2013  . First trimester bleeding 05/17/2013  . Supervision of normal pregnancy in second trimester 05/17/2013  . GBS (group B streptococcus) UTI complicating pregnancy 04/03/2013    To Do: 1. glucola and CBC next visit  [ ]  Vaccines: Flu: declined Tdap:  [x ] BCM: Mirena  Edu: [x ] PTL precautions; [ ]  BF class; [ ]  childbirth class; [ ]   BF counseling;

## 2013-07-28 ENCOUNTER — Other Ambulatory Visit: Payer: Self-pay

## 2013-07-28 ENCOUNTER — Ambulatory Visit (HOSPITAL_COMMUNITY)
Admission: RE | Admit: 2013-07-28 | Discharge: 2013-07-28 | Disposition: A | Discharge status: Home / Self Care | Payer: Medicaid Other | Source: Ambulatory Visit | Attending: Family Medicine | Admitting: Family Medicine

## 2013-07-28 ENCOUNTER — Other Ambulatory Visit: Payer: Self-pay | Admitting: Family Medicine

## 2013-07-28 ENCOUNTER — Encounter (HOSPITAL_COMMUNITY): Payer: Self-pay

## 2013-07-28 DIAGNOSIS — Z3689 Encounter for other specified antenatal screening: Secondary | ICD-10-CM | POA: Insufficient documentation

## 2013-07-28 DIAGNOSIS — R112 Nausea with vomiting, unspecified: Secondary | ICD-10-CM

## 2013-07-28 DIAGNOSIS — Z3492 Encounter for supervision of normal pregnancy, unspecified, second trimester: Secondary | ICD-10-CM

## 2013-07-28 DIAGNOSIS — D571 Sickle-cell disease without crisis: Secondary | ICD-10-CM | POA: Insufficient documentation

## 2013-07-28 DIAGNOSIS — O99019 Anemia complicating pregnancy, unspecified trimester: Secondary | ICD-10-CM | POA: Insufficient documentation

## 2013-07-28 DIAGNOSIS — B951 Streptococcus, group B, as the cause of diseases classified elsewhere: Secondary | ICD-10-CM

## 2013-07-28 MED ORDER — PROMETHAZINE HCL 25 MG PO TABS: 25.0000 mg | ORAL_TABLET | Freq: Four times a day (QID) | ORAL | Status: DC | PRN

## 2013-07-28 NOTE — Telephone Encounter (Signed)
Pt called the front desk and informed me that her pharmacy has been trying to get in touch with [place] concerning about her Zofran medication being out nationally.  Per Dr. Debroah Loop pt can have phenergan 25mg  po q 6 hours PRN.  Informed pt that we would prescribe new medication.  Pt stated understanding.

## 2013-08-01 ENCOUNTER — Encounter: Payer: Self-pay | Admitting: Family Medicine

## 2013-08-03 ENCOUNTER — Other Ambulatory Visit: Payer: Self-pay

## 2013-08-23 ENCOUNTER — Ambulatory Visit (INDEPENDENT_AMBULATORY_CARE_PROVIDER_SITE_OTHER): Payer: Medicaid Other | Admitting: Family

## 2013-08-23 VITALS — BP 114/74 | Temp 99.2°F | Wt 122.0 lb

## 2013-08-23 DIAGNOSIS — O2390 Unspecified genitourinary tract infection in pregnancy, unspecified trimester: Secondary | ICD-10-CM

## 2013-08-23 DIAGNOSIS — Z3492 Encounter for supervision of normal pregnancy, unspecified, second trimester: Secondary | ICD-10-CM

## 2013-08-23 DIAGNOSIS — D573 Sickle-cell trait: Secondary | ICD-10-CM

## 2013-08-23 LAB — POCT URINALYSIS DIP (DEVICE)
Hgb urine dipstick: NEGATIVE
Protein, ur: NEGATIVE mg/dL
Specific Gravity, Urine: 1.015 (ref 1.005–1.030)
Urobilinogen, UA: 0.2 mg/dL (ref 0.0–1.0)
pH: 5.5 (ref 5.0–8.0)

## 2013-08-23 NOTE — Progress Notes (Signed)
Pulse: 91

## 2013-08-30 ENCOUNTER — Other Ambulatory Visit: Payer: Medicaid Other

## 2013-08-30 ENCOUNTER — Ambulatory Visit (HOSPITAL_COMMUNITY)
Admission: RE | Admit: 2013-08-30 | Discharge: 2013-08-30 | Disposition: A | Discharge status: Home / Self Care | Payer: Medicaid Other | Source: Ambulatory Visit | Attending: Family | Admitting: Family

## 2013-08-30 DIAGNOSIS — Z3483 Encounter for supervision of other normal pregnancy, third trimester: Secondary | ICD-10-CM

## 2013-08-30 DIAGNOSIS — Z3492 Encounter for supervision of normal pregnancy, unspecified, second trimester: Secondary | ICD-10-CM

## 2013-08-30 DIAGNOSIS — D571 Sickle-cell disease without crisis: Secondary | ICD-10-CM | POA: Insufficient documentation

## 2013-08-30 DIAGNOSIS — O99019 Anemia complicating pregnancy, unspecified trimester: Secondary | ICD-10-CM | POA: Insufficient documentation

## 2013-08-30 DIAGNOSIS — O365990 Maternal care for other known or suspected poor fetal growth, unspecified trimester, not applicable or unspecified: Secondary | ICD-10-CM | POA: Insufficient documentation

## 2013-08-30 LAB — CBC
MCHC: 34.9 g/dL (ref 30.0–36.0)
Platelets: 194 10*3/uL (ref 150–400)
RDW: 13.4 % (ref 11.5–15.5)

## 2013-08-30 LAB — RPR

## 2013-08-30 LAB — HIV ANTIBODY (ROUTINE TESTING W REFLEX): HIV: NONREACTIVE

## 2013-08-30 LAB — GLUCOSE TOLERANCE, 1 HOUR (50G) W/O FASTING: Glucose, 1 Hour GTT: 78 mg/dL (ref 70–140)

## 2013-09-04 ENCOUNTER — Encounter: Payer: Self-pay | Admitting: Obstetrics & Gynecology

## 2013-09-07 ENCOUNTER — Ambulatory Visit (INDEPENDENT_AMBULATORY_CARE_PROVIDER_SITE_OTHER): Payer: Medicaid Other | Admitting: Obstetrics & Gynecology

## 2013-09-07 VITALS — BP 108/73 | Temp 97.4°F | Wt 128.5 lb

## 2013-09-07 DIAGNOSIS — O2686 Pruritic urticarial papules and plaques of pregnancy (PUPPP): Secondary | ICD-10-CM

## 2013-09-07 DIAGNOSIS — O9989 Other specified diseases and conditions complicating pregnancy, childbirth and the puerperium: Secondary | ICD-10-CM

## 2013-09-07 DIAGNOSIS — Z3492 Encounter for supervision of normal pregnancy, unspecified, second trimester: Secondary | ICD-10-CM

## 2013-09-07 DIAGNOSIS — L988 Other specified disorders of the skin and subcutaneous tissue: Secondary | ICD-10-CM

## 2013-09-07 DIAGNOSIS — Z23 Encounter for immunization: Secondary | ICD-10-CM

## 2013-09-07 LAB — POCT URINALYSIS DIP (DEVICE)
Nitrite: NEGATIVE
Protein, ur: NEGATIVE mg/dL
Urobilinogen, UA: 0.2 mg/dL (ref 0.0–1.0)

## 2013-09-07 MED ORDER — PREDNISONE 5 MG PO TABS: 5.0000 mg | ORAL_TABLET | Freq: Every day | ORAL | Status: DC

## 2013-09-07 MED ORDER — TETANUS-DIPHTH-ACELL PERTUSSIS 5-2.5-18.5 LF-MCG/0.5 IM SUSP: 0.5000 mL | Freq: Once | INTRAMUSCULAR | Status: DC

## 2013-09-07 NOTE — Patient Instructions (Signed)
Pruritic Urticarial Papules and Plaques of Pregnancy When you are pregnant, your body changes in many ways. That includes the skin. Rashes sometimes develop. One skin rash that can happen during pregnancy is called pruritic urticarial papules and plaques of pregnancy (PUPPP). The small red bumps sometimes form large plaques. These are very itchy. The rash usually appears in the last few weeks of pregnancy during the third trimester. Sometimes, it can occur shortly after giving birth. It goes away shortly after your baby is born. It does not harm you or your baby and will not leave scars on your skin. PUPPP is most common in first pregnancies or in those involving more than one baby. It usually will not return during later pregnancies. CAUSES  The exact cause is unknown. However, it may be related to your skin stretching rapidly due to pregnancy.  SYMPTOMS  PUPPP symptoms include a very itchy rash. The rash often looks red and raised and is most often seen on the abdomen. It can spread to the legs, thighs, or arms. Sometimes tiny blisters form in the center of the rash patches. The skin around the rash is often pale. DIAGNOSIS  To decide if you have PUPPP, your health care provider will perform a physical exam and ask questions about your symptoms. He or she may order blood tests to rule out other causes of the rash. TREATMENT  The goal is to stop the itching and keep the rash from spreading. Usually, a cream is used to do this. However, treatment varies. Common options include medicines that relieve or lessen itching. Some medicines may be in the form of a cream or ointment, while others you may take by mouth (orally). The medicines are either corticosteroids or antihistamines. Treatment helps nearly all women with this rash. The creams, ointments, or pills should make your skin feel better fairly quickly.  HOME CARE INSTRUCTIONS   Only take over-the-counter or prescription medicines as directed by your  health care provider.  Apply any creams as directed by your health care provider.  Do not scratch the rash.  Wear loose clothing.  Keep all follow-up appointments with your health care provider. SEEK MEDICAL CARE IF:   The itching does not go away after treatment.  Your rash continues to spread.  You are unable to sleep because of the irritation. Document Released: 12/09/2009 Document Revised: 05/17/2013 Document Reviewed: 02/26/2013 [facility] Patient Information 2014 [place], [place].

## 2013-09-07 NOTE — Progress Notes (Signed)
P= 87 Pt. C/o of feeling constant itchyness all over her body, has tried lotions and warm tubs that have not helped. States she's been using non-scented soaps, and detergents for sensitive skin but nothing has helped yet.

## 2013-09-07 NOTE — Progress Notes (Signed)
Papular rash with itch trunk and extremities c/w PUPPP, recommend benadryl, would consider low dose steroid if not effective. Recommend flu vaccine and Tdap  Nl growth on [place]

## 2013-09-14 ENCOUNTER — Ambulatory Visit (INDEPENDENT_AMBULATORY_CARE_PROVIDER_SITE_OTHER): Payer: Medicaid Other | Admitting: Obstetrics & Gynecology

## 2013-09-14 ENCOUNTER — Encounter: Payer: Self-pay | Admitting: Obstetrics & Gynecology

## 2013-09-14 VITALS — BP 116/74 | Temp 97.7°F | Wt 131.0 lb

## 2013-09-14 DIAGNOSIS — O2686 Pruritic urticarial papules and plaques of pregnancy (PUPPP): Secondary | ICD-10-CM

## 2013-09-14 DIAGNOSIS — D573 Sickle-cell trait: Secondary | ICD-10-CM

## 2013-09-14 DIAGNOSIS — O283 Abnormal ultrasonic finding on antenatal screening of mother: Secondary | ICD-10-CM

## 2013-09-14 DIAGNOSIS — O43193 Other malformation of placenta, third trimester: Secondary | ICD-10-CM | POA: Insufficient documentation

## 2013-09-14 DIAGNOSIS — O289 Unspecified abnormal findings on antenatal screening of mother: Secondary | ICD-10-CM

## 2013-09-14 DIAGNOSIS — L988 Other specified disorders of the skin and subcutaneous tissue: Secondary | ICD-10-CM

## 2013-09-14 DIAGNOSIS — O9989 Other specified diseases and conditions complicating pregnancy, childbirth and the puerperium: Secondary | ICD-10-CM

## 2013-09-14 DIAGNOSIS — O43899 Other placental disorders, unspecified trimester: Secondary | ICD-10-CM

## 2013-09-14 DIAGNOSIS — Z3492 Encounter for supervision of normal pregnancy, unspecified, second trimester: Secondary | ICD-10-CM

## 2013-09-14 LAB — POCT URINALYSIS DIP (DEVICE)
Bilirubin Urine: NEGATIVE
Hgb urine dipstick: NEGATIVE
Ketones, ur: NEGATIVE mg/dL
Protein, ur: NEGATIVE mg/dL
Specific Gravity, Urine: 1.015 (ref 1.005–1.030)
Urobilinogen, UA: 0.2 mg/dL (ref 0.0–1.0)

## 2013-09-14 LAB — COMPREHENSIVE METABOLIC PANEL
ALT: 13 U/L (ref 0–35)
Albumin: 3.6 g/dL (ref 3.5–5.2)
CO2: 25 mEq/L (ref 19–32)
Calcium: 9.6 mg/dL (ref 8.4–10.5)
Chloride: 103 mEq/L (ref 96–112)
Creat: 0.65 mg/dL (ref 0.50–1.10)
Glucose, Bld: 90 mg/dL (ref 70–99)
Total Bilirubin: 0.3 mg/dL (ref 0.3–1.2)
Total Protein: 6.4 g/dL (ref 6.0–8.3)

## 2013-09-14 NOTE — Progress Notes (Signed)
MFM consult and U/S scheduled 09/29/13 at 11 am. Fetal Echo scheduled with Dr. Elizebeth Brooking on 10/02/13 at 930 am. Case # [id number] Auth # [id number].

## 2013-09-14 NOTE — Progress Notes (Signed)
Pt still complaining of itching.  Inguinal area, back, knees.  Pt did not take prednisone.  Will get bile salts.  If negative will try triamcenalone cream.  Pt also getting serial [place] as well as to start fetal testing.  ?succinturiate lobe?  Will ask MFM.  Pt was also to get a fetal echo for thickened nuchal fold.  Will order that today as well.  Last US showed baby breech.

## 2013-09-14 NOTE — Progress Notes (Signed)
Mariah Wallace left without giving a clean catch urine- called [name] and she will come back to give the urine today. Explained how to collect clean catch urine to her.

## 2013-09-14 NOTE — Progress Notes (Signed)
P= 100 C/o of itching from breasts to knees.States prednisone does not help.

## 2013-09-16 LAB — BILE ACIDS, TOTAL: Bile Acids Total: 10 umol/L (ref 0–19)

## 2013-09-17 LAB — CULTURE, OB URINE: Colony Count: >=100000

## 2013-09-18 ENCOUNTER — Other Ambulatory Visit: Payer: Medicaid Other

## 2013-09-20 ENCOUNTER — Other Ambulatory Visit: Payer: Medicaid Other

## 2013-09-25 ENCOUNTER — Ambulatory Visit (INDEPENDENT_AMBULATORY_CARE_PROVIDER_SITE_OTHER): Payer: Medicaid Other | Admitting: Obstetrics & Gynecology

## 2013-09-25 ENCOUNTER — Other Ambulatory Visit: Payer: Medicaid Other

## 2013-09-25 VITALS — BP 113/75 | Wt 131.3 lb

## 2013-09-25 DIAGNOSIS — Z3492 Encounter for supervision of normal pregnancy, unspecified, second trimester: Secondary | ICD-10-CM

## 2013-09-25 DIAGNOSIS — O43899 Other placental disorders, unspecified trimester: Secondary | ICD-10-CM

## 2013-09-25 LAB — POCT URINALYSIS DIP (DEVICE)
Glucose, UA: NEGATIVE mg/dL
Hgb urine dipstick: NEGATIVE
Leukocytes, UA: NEGATIVE
Nitrite: NEGATIVE
Protein, ur: NEGATIVE mg/dL
Specific Gravity, Urine: 1.01 (ref 1.005–1.030)
Urobilinogen, UA: 0.2 mg/dL (ref 0.0–1.0)
pH: 6.5 (ref 5.0–8.0)

## 2013-09-25 NOTE — Progress Notes (Signed)
NST reactive. Scheduled for [place] f/u.

## 2013-09-25 NOTE — Patient Instructions (Signed)
Third Trimester of Pregnancy  The third trimester is from week 29 through week 42, months 7 through 9. The third trimester is a time when the fetus is growing rapidly. At the end of the ninth month, the fetus is about 20 inches in length and weighs 6 10 pounds.   BODY CHANGES  Your body goes through many changes during pregnancy. The changes vary from woman to woman.    Your weight will continue to increase. You can expect to gain 25 35 pounds (11 16 kg) by the end of the pregnancy.   You may begin to get stretch marks on your hips, abdomen, and breasts.   You may urinate more often because the fetus is moving lower into your pelvis and pressing on your bladder.   You may develop or continue to have heartburn as a result of your pregnancy.   You may develop constipation because certain hormones are causing the muscles that push waste through your intestines to slow down.   You may develop hemorrhoids or swollen, bulging veins (varicose veins).   You may have pelvic pain because of the weight gain and pregnancy hormones relaxing your joints between the bones in your pelvis. Back aches may result from over exertion of the muscles supporting your posture.   Your breasts will continue to grow and be tender. A yellow discharge may leak from your breasts called colostrum.   Your belly button may stick out.   You may feel short of breath because of your expanding uterus.   You may notice the fetus "dropping," or moving lower in your abdomen.   You may have a bloody mucus discharge. This usually occurs a few days to a week before labor begins.   Your cervix becomes thin and soft (effaced) near your due date.  WHAT TO EXPECT AT YOUR PRENATAL EXAMS   You will have prenatal exams every 2 weeks until week 36. Then, you will have weekly prenatal exams. During a routine prenatal visit:   You will be weighed to make sure you and the fetus are growing normally.   Your blood pressure is taken.   Your abdomen will be  measured to track your baby's growth.   The fetal heartbeat will be listened to.   Any test results from the previous visit will be discussed.   You may have a cervical check near your due date to see if you have effaced.  At around 36 weeks, your caregiver will check your cervix. At the same time, your caregiver will also perform a test on the secretions of the vaginal tissue. This test is to determine if a type of bacteria, Group B streptococcus, is present. Your caregiver will explain this further.  Your caregiver may ask you:   What your birth plan is.   How you are feeling.   If you are feeling the baby move.   If you have had any abnormal symptoms, such as leaking fluid, bleeding, severe headaches, or abdominal cramping.   If you have any questions.  Other tests or screenings that may be performed during your third trimester include:   Blood tests that check for low iron levels (anemia).   Fetal testing to check the health, activity level, and growth of the fetus. Testing is done if you have certain medical conditions or if there are problems during the pregnancy.  FALSE LABOR  You may feel small, irregular contractions that eventually go away. These are called Braxton Hicks contractions, or   false labor. Contractions may last for hours, days, or even weeks before true labor sets in. If contractions come at regular intervals, intensify, or become painful, it is best to be seen by your caregiver.   SIGNS OF LABOR    Menstrual-like cramps.   Contractions that are 5 minutes apart or less.   Contractions that start on the top of the uterus and spread down to the lower abdomen and back.   A sense of increased pelvic pressure or back pain.   A watery or bloody mucus discharge that comes from the vagina.  If you have any of these signs before the 37th week of pregnancy, call your caregiver right away. You need to go to the hospital to get checked immediately.  HOME CARE INSTRUCTIONS    Avoid all  smoking, herbs, alcohol, and unprescribed drugs. These chemicals affect the formation and growth of the baby.   Follow your caregiver's instructions regarding medicine use. There are medicines that are either safe or unsafe to take during pregnancy.   Exercise only as directed by your caregiver. Experiencing uterine cramps is a good sign to stop exercising.   Continue to eat regular, healthy meals.   Wear a good support bra for breast tenderness.   Do not use hot tubs, steam rooms, or saunas.   Wear your seat belt at all times when driving.   Avoid raw meat, uncooked cheese, cat litter boxes, and soil used by cats. These carry germs that can cause birth defects in the baby.   Take your prenatal vitamins.   Try taking a stool softener (if your caregiver approves) if you develop constipation. Eat more high-fiber foods, such as fresh vegetables or fruit and whole grains. Drink plenty of fluids to keep your urine clear or pale yellow.   Take warm sitz baths to soothe any pain or discomfort caused by hemorrhoids. Use hemorrhoid cream if your caregiver approves.   If you develop varicose veins, wear support hose. Elevate your feet for 15 minutes, 3 4 times a day. Limit salt in your diet.   Avoid heavy lifting, wear low heal shoes, and practice good posture.   Rest a lot with your legs elevated if you have leg cramps or low back pain.   Visit your dentist if you have not gone during your pregnancy. Use a soft toothbrush to brush your teeth and be gentle when you floss.   A sexual relationship may be continued unless your caregiver directs you otherwise.   Do not travel far distances unless it is absolutely necessary and only with the approval of your caregiver.   Take prenatal classes to understand, practice, and ask questions about the labor and delivery.   Make a trial run to the hospital.   Pack your hospital bag.   Prepare the baby's nursery.   Continue to go to all your prenatal visits as directed  by your caregiver.  SEEK MEDICAL CARE IF:   You are unsure if you are in labor or if your water has broken.   You have dizziness.   You have mild pelvic cramps, pelvic pressure, or nagging pain in your abdominal area.   You have persistent nausea, vomiting, or diarrhea.   You have a bad smelling vaginal discharge.   You have pain with urination.  SEEK IMMEDIATE MEDICAL CARE IF:    You have a fever.   You are leaking fluid from your vagina.   You have spotting or bleeding from your vagina.     You have severe abdominal cramping or pain.   You have rapid weight loss or gain.   You have shortness of breath with chest pain.   You notice sudden or extreme swelling of your face, hands, ankles, feet, or legs.   You have not felt your baby move in over an hour.   You have severe headaches that do not go away with medicine.   You have vision changes.  Document Released: 09/08/2001 Document Revised: 05/17/2013 Document Reviewed: 11/15/2012  [employer] Patient Information 2014 [employer].

## 2013-09-25 NOTE — Progress Notes (Signed)
P=83. States pnv was making her sick so switched back to gummi vitamins which don't make her sick. Switched from nst only to obfu/nst for today due to missed ob fu last week .

## 2013-09-28 NOTE — L&D Delivery Note (Signed)
Attestation of Attending Supervision of Fellow: Evaluation and management procedures were performed by the Fellow under my supervision and collaboration.  I have reviewed the Fellow's note and chart, and I agree with the management and plan.    

## 2013-09-28 NOTE — L&D Delivery Note (Signed)
Delivery Note At 1:45 AM a viable female was delivered via Vaginal, Spontaneous Delivery (Presentation: ;  ).  APGAR: 9, ; weight .   Placenta status: Intact, Spontaneous.  Cord: 3 vessels with the following complications: None.   Anesthesia: Local  Episiotomy: None Lacerations: 1st degree Suture Repair: 3.0 monocryl on CT Est. Blood Loss (mL): 250  Mom to postpartum.  Baby to Couplet care / Skin to Skin.  Mariah Wallace had a precipitous delivery and was unable to receive abx treatment for GBS+ status. Mother rapidly progressed and delivered NSVD over 1st degree tear. Active management of 3rd stage with pit and traction delivered intact placenta. Placenta evaluation showed placenta and succenturiata lobe. Repaired in usual fashion. EBL 250. Counts correct. Placenta not sent to path as lobe found with placenta.  Mariah Wallace 10/18/2013, 2:04 AM

## 2013-09-29 ENCOUNTER — Ambulatory Visit (HOSPITAL_COMMUNITY)
Admission: RE | Admit: 2013-09-29 | Discharge: 2013-09-29 | Disposition: A | Discharge status: Home / Self Care | Payer: Medicaid Other | Source: Ambulatory Visit | Attending: Obstetrics & Gynecology | Admitting: Obstetrics & Gynecology

## 2013-09-29 ENCOUNTER — Ambulatory Visit (HOSPITAL_COMMUNITY): Payer: Medicaid Other

## 2013-09-29 DIAGNOSIS — O43193 Other malformation of placenta, third trimester: Secondary | ICD-10-CM

## 2013-09-29 DIAGNOSIS — O283 Abnormal ultrasonic finding on antenatal screening of mother: Secondary | ICD-10-CM

## 2013-10-03 ENCOUNTER — Telehealth: Payer: Self-pay | Admitting: *Deleted

## 2013-10-03 NOTE — Telephone Encounter (Signed)
Message copied by Samuel Germany on Tue Oct 03, 2013  4:39 PM ------      Message from: Guss Bunde      Created: Sun Oct 01, 2013  5:48 PM       Pt did not show for [place].  Can you call pt and document.   ------

## 2013-10-03 NOTE — Telephone Encounter (Signed)
Called MFM to reschedule [place] and md eval appointments.Unable to schedule around her obfu/nst.   Called Betzaida to notify and she is unable to do afternoon appointments- informed her I would reschedule her MFM appointments for a morning appointment.  Called MFM and rescheduled and notified Marilin.

## 2013-10-04 ENCOUNTER — Encounter: Payer: Self-pay | Admitting: *Deleted

## 2013-10-05 ENCOUNTER — Ambulatory Visit (HOSPITAL_COMMUNITY): Payer: Medicaid Other

## 2013-10-05 ENCOUNTER — Ambulatory Visit (INDEPENDENT_AMBULATORY_CARE_PROVIDER_SITE_OTHER): Payer: Medicaid Other | Admitting: Obstetrics & Gynecology

## 2013-10-05 VITALS — BP 121/78 | Wt 136.4 lb

## 2013-10-05 DIAGNOSIS — O43899 Other placental disorders, unspecified trimester: Secondary | ICD-10-CM

## 2013-10-05 DIAGNOSIS — O99713 Diseases of the skin and subcutaneous tissue complicating pregnancy, third trimester: Secondary | ICD-10-CM

## 2013-10-05 DIAGNOSIS — B951 Streptococcus, group B, as the cause of diseases classified elsewhere: Secondary | ICD-10-CM

## 2013-10-05 DIAGNOSIS — Z3492 Encounter for supervision of normal pregnancy, unspecified, second trimester: Secondary | ICD-10-CM

## 2013-10-05 DIAGNOSIS — O9989 Other specified diseases and conditions complicating pregnancy, childbirth and the puerperium: Secondary | ICD-10-CM

## 2013-10-05 DIAGNOSIS — N390 Urinary tract infection, site not specified: Secondary | ICD-10-CM

## 2013-10-05 DIAGNOSIS — L299 Pruritus, unspecified: Secondary | ICD-10-CM

## 2013-10-05 DIAGNOSIS — O43193 Other malformation of placenta, third trimester: Secondary | ICD-10-CM

## 2013-10-05 DIAGNOSIS — O2340 Unspecified infection of urinary tract in pregnancy, unspecified trimester: Secondary | ICD-10-CM

## 2013-10-05 DIAGNOSIS — O2390 Unspecified genitourinary tract infection in pregnancy, unspecified trimester: Secondary | ICD-10-CM

## 2013-10-05 LAB — COMPREHENSIVE METABOLIC PANEL
ALK PHOS: 131 U/L — AB (ref 39–117)
ALT: 12 U/L (ref 0–35)
AST: 16 U/L (ref 0–37)
Albumin: 3.3 g/dL — ABNORMAL LOW (ref 3.5–5.2)
BILIRUBIN TOTAL: 0.4 mg/dL (ref 0.3–1.2)
BUN: 9 mg/dL (ref 6–23)
CO2: 25 mEq/L (ref 19–32)
CREATININE: 0.7 mg/dL (ref 0.50–1.10)
Calcium: 8.8 mg/dL (ref 8.4–10.5)
Chloride: 105 mEq/L (ref 96–112)
Glucose, Bld: 79 mg/dL (ref 70–99)
Potassium: 4.3 mEq/L (ref 3.5–5.3)
Sodium: 138 mEq/L (ref 135–145)
Total Protein: 6.2 g/dL (ref 6.0–8.3)

## 2013-10-05 LAB — POCT URINALYSIS DIP (DEVICE)
Bilirubin Urine: NEGATIVE
Glucose, UA: NEGATIVE mg/dL
Hgb urine dipstick: NEGATIVE
Ketones, ur: NEGATIVE mg/dL
LEUKOCYTES UA: NEGATIVE
Nitrite: NEGATIVE
PROTEIN: NEGATIVE mg/dL
Specific Gravity, Urine: 1.02 (ref 1.005–1.030)
UROBILINOGEN UA: 0.2 mg/dL (ref 0.0–1.0)
pH: 6 (ref 5.0–8.0)

## 2013-10-05 LAB — US OB FOLLOW UP

## 2013-10-05 NOTE — Patient Instructions (Signed)
Return to clinic for any obstetric concerns or go to MAU for evaluation  

## 2013-10-05 NOTE — Progress Notes (Signed)
Pulse- 98 Patient reports some pelvic pressure

## 2013-10-05 NOTE — Progress Notes (Signed)
NST performed today was reviewed and was found to be reactive. AFI normal at 15. 1 cm  Continue recommended antenatal testing and prenatal care.  GC/Chlam urine cultures done today, patient is already GBS positive.  Patient is still having significant itching, bile acids on 09/14/13 was 10. Will recheck them today, also check CMET.  No other complaints or concerns.  Fetal movement and labor precautions reviewed.

## 2013-10-06 LAB — GC/CHLAMYDIA PROBE AMP
CT PROBE, AMP APTIMA: NEGATIVE
GC Probe RNA: NEGATIVE

## 2013-10-07 LAB — BILE ACIDS, TOTAL: Bile Acids Total: 9 umol/L (ref 0–19)

## 2013-10-10 ENCOUNTER — Other Ambulatory Visit: Payer: Medicaid Other

## 2013-10-10 ENCOUNTER — Telehealth: Payer: Self-pay | Admitting: Obstetrics and Gynecology

## 2013-10-10 NOTE — Telephone Encounter (Signed)
Patient called at 8:24 to say she would be not be coming today. At first she stated she thought she would be late, but has an appointment on Thursday so she would just come at that time.

## 2013-10-12 ENCOUNTER — Other Ambulatory Visit: Payer: Medicaid Other

## 2013-10-13 ENCOUNTER — Ambulatory Visit (HOSPITAL_COMMUNITY): Payer: Medicaid Other

## 2013-10-13 ENCOUNTER — Ambulatory Visit (HOSPITAL_COMMUNITY): Admission: RE | Admit: 2013-10-13 | Payer: Medicaid Other | Source: Ambulatory Visit

## 2013-10-16 ENCOUNTER — Encounter: Payer: Self-pay | Admitting: Family Medicine

## 2013-10-16 ENCOUNTER — Ambulatory Visit (INDEPENDENT_AMBULATORY_CARE_PROVIDER_SITE_OTHER): Payer: Medicaid Other | Admitting: Family Medicine

## 2013-10-16 VITALS — BP 136/75 | Wt 135.3 lb

## 2013-10-16 DIAGNOSIS — Z3480 Encounter for supervision of other normal pregnancy, unspecified trimester: Secondary | ICD-10-CM

## 2013-10-16 DIAGNOSIS — O0990 Supervision of high risk pregnancy, unspecified, unspecified trimester: Secondary | ICD-10-CM

## 2013-10-16 DIAGNOSIS — O43899 Other placental disorders, unspecified trimester: Secondary | ICD-10-CM

## 2013-10-16 DIAGNOSIS — Z3492 Encounter for supervision of normal pregnancy, unspecified, second trimester: Secondary | ICD-10-CM

## 2013-10-16 DIAGNOSIS — O43193 Other malformation of placenta, third trimester: Secondary | ICD-10-CM

## 2013-10-16 LAB — POCT URINALYSIS DIP (DEVICE)
BILIRUBIN URINE: NEGATIVE
Glucose, UA: NEGATIVE mg/dL
Ketones, ur: NEGATIVE mg/dL
Nitrite: NEGATIVE
PH: 6 (ref 5.0–8.0)
Protein, ur: NEGATIVE mg/dL
SPECIFIC GRAVITY, URINE: 1.015 (ref 1.005–1.030)
Urobilinogen, UA: 0.2 mg/dL (ref 0.0–1.0)

## 2013-10-16 MED ORDER — ENSURE COMPLETE PO LIQD: 237.0000 mL | Freq: Two times a day (BID) | ORAL | Status: DC

## 2013-10-16 NOTE — Progress Notes (Signed)
P = 72     Pt has pelvic pressure. Pt states she has missed appts due to lack of transportation. Next [place] for growth @ MFM on 10/27/13.

## 2013-10-16 NOTE — Progress Notes (Signed)
+  FM, no lof no vb, occasional ctx 130smod var mult accels, no decel q5-16m ctx  Missed last [place] - attempt to schedule sooner.  Mariah Wallace is a 28 y.o. G3P0020 at [redacted]w[redacted]d here for [place] visit.    Discussed with Patient:  - Plans to breast/bottle feed.  All questions answered. - Continue prenatal vitamins. - Reviewed fetal kick counts Pt to perform daily at a time when the baby is active, lie laterally with both hands on belly in quiet room and count all movements (hiccups, shoulder rolls, obvious kicks, etc); pt is to report to clinic MAU for less than 10 movements felt in a 2 hour time period-pt told as soon as she counts 10 movements the count is complete.  - Routine precautions discussed (depression, infection s/s).   Patient provided with all pertinent phone numbers for emergencies. - RTC for any VB, regular, painful cramps/ctxs occurring at a rate of >2/10 min, fever (100.5 or higher), n/v/d, any pain that is unresolving or worsening, LOF, decreased fetal movement, CP, SOB, edema -RTC in one week for next visit.  Problems: Patient Active Problem List   Diagnosis Date Noted  . 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 PUPPP (pruritic urticarial papules and plaques of pregnancy) 09/07/2013  . Sickle cell trait 05/17/2013  . First trimester bleeding 05/17/2013  . Supervision of normal pregnancy in second trimester 05/17/2013  . GBS (group B streptococcus) UTI complicating pregnancy 82/99/3716    To Do: 1.

## 2013-10-16 NOTE — Patient Instructions (Signed)
Third Trimester of Pregnancy The third trimester is from week 29 through week 42, months 7 through 9. The third trimester is a time when the fetus is growing rapidly. At the end of the ninth month, the fetus is about 20 inches in length and weighs 6 10 pounds.  BODY CHANGES Your body goes through many changes during pregnancy. The changes vary from woman to woman.   Your weight will continue to increase. You can expect to gain 25 35 pounds (11 16 kg) by the end of the pregnancy.  You may begin to get stretch marks on your hips, abdomen, and breasts.  You may urinate more often because the fetus is moving lower into your pelvis and pressing on your bladder.  You may develop or continue to have heartburn as a result of your pregnancy.  You may develop constipation because certain hormones are causing the muscles that push waste through your intestines to slow down.  You may develop hemorrhoids or swollen, bulging veins (varicose veins).  You may have pelvic pain because of the weight gain and pregnancy hormones relaxing your joints between the bones in your pelvis. Back aches may result from over exertion of the muscles supporting your posture.  Your breasts will continue to grow and be tender. A yellow discharge may leak from your breasts called colostrum.  Your belly button may stick out.  You may feel short of breath because of your expanding uterus.  You may notice the fetus "dropping," or moving lower in your abdomen.  You may have a bloody mucus discharge. This usually occurs a few days to a week before labor begins.  Your cervix becomes thin and soft (effaced) near your due date. WHAT TO EXPECT AT YOUR PRENATAL EXAMS  You will have prenatal exams every 2 weeks until week 36. Then, you will have weekly prenatal exams. During a routine prenatal visit:  You will be weighed to make sure you and the fetus are growing normally.  Your blood pressure is taken.  Your abdomen will be  measured to track your baby's growth.  The fetal heartbeat will be listened to.  Any test results from the previous visit will be discussed.  You may have a cervical check near your due date to see if you have effaced. At around 36 weeks, your caregiver will check your cervix. At the same time, your caregiver will also perform a test on the secretions of the vaginal tissue. This test is to determine if a type of bacteria, Group B streptococcus, is present. Your caregiver will explain this further. Your caregiver may ask you:  What your birth plan is.  How you are feeling.  If you are feeling the baby move.  If you have had any abnormal symptoms, such as leaking fluid, bleeding, severe headaches, or abdominal cramping.  If you have any questions. Other tests or screenings that may be performed during your third trimester include:  Blood tests that check for low iron levels (anemia).  Fetal testing to check the health, activity level, and growth of the fetus. Testing is done if you have certain medical conditions or if there are problems during the pregnancy. FALSE LABOR You may feel small, irregular contractions that eventually go away. These are called Braxton Hicks contractions, or false labor. Contractions may last for hours, days, or even weeks before true labor sets in. If contractions come at regular intervals, intensify, or become painful, it is best to be seen by your caregiver.  SIGNS OF LABOR   Menstrual-like cramps.  Contractions that are 5 minutes apart or less.  Contractions that start on the top of the uterus and spread down to the lower abdomen and back.  A sense of increased pelvic pressure or back pain.  A watery or bloody mucus discharge that comes from the vagina. If you have any of these signs before the 37th week of pregnancy, call your caregiver right away. You need to go to the hospital to get checked immediately. HOME CARE INSTRUCTIONS   Avoid all  smoking, herbs, alcohol, and unprescribed drugs. These chemicals affect the formation and growth of the baby.  Follow your caregiver's instructions regarding medicine use. There are medicines that are either safe or unsafe to take during pregnancy.  Exercise only as directed by your caregiver. Experiencing uterine cramps is a good sign to stop exercising.  Continue to eat regular, healthy meals.  Wear a good support bra for breast tenderness.  Do not use hot tubs, steam rooms, or saunas.  Wear your seat belt at all times when driving.  Avoid raw meat, uncooked cheese, cat litter boxes, and soil used by cats. These carry germs that can cause birth defects in the baby.  Take your prenatal vitamins.  Try taking a stool softener (if your caregiver approves) if you develop constipation. Eat more high-fiber foods, such as fresh vegetables or fruit and whole grains. Drink plenty of fluids to keep your urine clear or pale yellow.  Take warm sitz baths to soothe any pain or discomfort caused by hemorrhoids. Use hemorrhoid cream if your caregiver approves.  If you develop varicose veins, wear support hose. Elevate your feet for 15 minutes, 3 4 times a day. Limit salt in your diet.  Avoid heavy lifting, wear low heal shoes, and practice good posture.  Rest a lot with your legs elevated if you have leg cramps or low back pain.  Visit your dentist if you have not gone during your pregnancy. Use a soft toothbrush to brush your teeth and be gentle when you floss.  A sexual relationship may be continued unless your caregiver directs you otherwise.  Do not travel far distances unless it is absolutely necessary and only with the approval of your caregiver.  Take prenatal classes to understand, practice, and ask questions about the labor and delivery.  Make a trial run to the hospital.  Pack your hospital bag.  Prepare the baby's nursery.  Continue to go to all your prenatal visits as directed  by your caregiver. SEEK MEDICAL CARE IF:  You are unsure if you are in labor or if your water has broken.  You have dizziness.  You have mild pelvic cramps, pelvic pressure, or nagging pain in your abdominal area.  You have persistent nausea, vomiting, or diarrhea.  You have a bad smelling vaginal discharge.  You have pain with urination. SEEK IMMEDIATE MEDICAL CARE IF:   You have a fever.  You are leaking fluid from your vagina.  You have spotting or bleeding from your vagina.  You have severe abdominal cramping or pain.  You have rapid weight loss or gain.  You have shortness of breath with chest pain.  You notice sudden or extreme swelling of your face, hands, ankles, feet, or legs.  You have not felt your baby move in over an hour.  You have severe headaches that do not go away with medicine.  You have vision changes. Document Released: 09/08/2001 Document Revised: 05/17/2013 Document Reviewed:   You have severe abdominal cramping or pain.   You have rapid weight loss or gain.   You have shortness of breath with chest pain.   You notice sudden or extreme swelling of your face, hands, ankles, feet, or legs.   You have not felt your baby move in over an hour.   You have severe headaches that do not go away with medicine.   You have vision changes.  Document Released: 09/08/2001 Document Revised: 05/17/2013 Document Reviewed: 11/15/2012  [employer] Patient Information 2014 [employer].

## 2013-10-18 ENCOUNTER — Encounter: Payer: Self-pay | Admitting: *Deleted

## 2013-10-18 ENCOUNTER — Encounter (HOSPITAL_COMMUNITY): Payer: Self-pay

## 2013-10-18 ENCOUNTER — Inpatient Hospital Stay (HOSPITAL_COMMUNITY)
Admission: AD | Admit: 2013-10-18 | Discharge: 2013-10-19 | DRG: 775 | Disposition: A | Discharge status: Home / Self Care | Payer: Medicaid Other | Source: Ambulatory Visit | Attending: Obstetrics & Gynecology | Admitting: Obstetrics & Gynecology

## 2013-10-18 DIAGNOSIS — D573 Sickle-cell trait: Secondary | ICD-10-CM | POA: Diagnosis present

## 2013-10-18 DIAGNOSIS — O99892 Other specified diseases and conditions complicating childbirth: Secondary | ICD-10-CM | POA: Diagnosis present

## 2013-10-18 DIAGNOSIS — O99893 Other specified diseases and conditions complicating puerperium: Secondary | ICD-10-CM | POA: Diagnosis present

## 2013-10-18 DIAGNOSIS — O2340 Unspecified infection of urinary tract in pregnancy, unspecified trimester: Secondary | ICD-10-CM

## 2013-10-18 DIAGNOSIS — R030 Elevated blood-pressure reading, without diagnosis of hypertension: Secondary | ICD-10-CM

## 2013-10-18 DIAGNOSIS — Z87891 Personal history of nicotine dependence: Secondary | ICD-10-CM

## 2013-10-18 DIAGNOSIS — O9989 Other specified diseases and conditions complicating pregnancy, childbirth and the puerperium: Secondary | ICD-10-CM

## 2013-10-18 DIAGNOSIS — Z22330 Carrier of Group B streptococcus: Secondary | ICD-10-CM

## 2013-10-18 DIAGNOSIS — O9902 Anemia complicating childbirth: Secondary | ICD-10-CM

## 2013-10-18 DIAGNOSIS — IMO0001 Reserved for inherently not codable concepts without codable children: Secondary | ICD-10-CM

## 2013-10-18 DIAGNOSIS — B951 Streptococcus, group B, as the cause of diseases classified elsewhere: Secondary | ICD-10-CM

## 2013-10-18 LAB — CBC
HEMATOCRIT: 33.5 % — AB (ref 36.0–46.0)
Hemoglobin: 11.6 g/dL — ABNORMAL LOW (ref 12.0–15.0)
MCH: 27.2 pg (ref 26.0–34.0)
MCHC: 34.6 g/dL (ref 30.0–36.0)
MCV: 78.6 fL (ref 78.0–100.0)
Platelets: 203 10*3/uL (ref 150–400)
RBC: 4.26 MIL/uL (ref 3.87–5.11)
RDW: 13.7 % (ref 11.5–15.5)
WBC: 15.6 10*3/uL — ABNORMAL HIGH (ref 4.0–10.5)

## 2013-10-18 LAB — COMPREHENSIVE METABOLIC PANEL
ALK PHOS: 136 U/L — AB (ref 39–117)
ALT: 11 U/L (ref 0–35)
AST: 16 U/L (ref 0–37)
Albumin: 2.9 g/dL — ABNORMAL LOW (ref 3.5–5.2)
BILIRUBIN TOTAL: 0.4 mg/dL (ref 0.3–1.2)
BUN: 7 mg/dL (ref 6–23)
CO2: 21 mEq/L (ref 19–32)
CREATININE: 0.66 mg/dL (ref 0.50–1.10)
Calcium: 9 mg/dL (ref 8.4–10.5)
Chloride: 103 mEq/L (ref 96–112)
GFR calc non Af Amer: >90 mL/min (ref >90)
Glucose, Bld: 114 mg/dL — ABNORMAL HIGH (ref 70–99)
POTASSIUM: 3.9 meq/L (ref 3.7–5.3)
Sodium: 139 mEq/L (ref 137–147)
TOTAL PROTEIN: 6 g/dL (ref 6.0–8.3)

## 2013-10-18 LAB — PROTEIN / CREATININE RATIO, URINE
CREATININE, URINE: 64.68 mg/dL
Protein Creatinine Ratio: 0.13 (ref 0.00–0.15)
Total Protein, Urine: 8.5 mg/dL

## 2013-10-18 LAB — RPR: RPR Ser Ql: NONREACTIVE

## 2013-10-18 MED ORDER — OXYTOCIN 40 UNITS IN LACTATED RINGERS INFUSION - SIMPLE MED
INTRAVENOUS | Status: AC
  Filled 2013-10-18: qty 1000

## 2013-10-18 MED ORDER — WITCH HAZEL-GLYCERIN EX PADS: 1.0000 "application " | MEDICATED_PAD | CUTANEOUS | Status: DC | PRN

## 2013-10-18 MED ORDER — SODIUM CHLORIDE 0.9 % IV SOLN: 2.0000 g | Freq: Four times a day (QID) | INTRAVENOUS | Status: DC

## 2013-10-18 MED ORDER — OXYTOCIN 40 UNITS IN LACTATED RINGERS INFUSION - SIMPLE MED: 62.5000 mL/h | INTRAVENOUS | Status: DC

## 2013-10-18 MED ORDER — OXYCODONE-ACETAMINOPHEN 5-325 MG PO TABS
1.0000 | ORAL_TABLET | ORAL | Status: DC | PRN
  Administered 2013-10-18 (×2): 1 via ORAL
  Administered 2013-10-19 (×2): 2 via ORAL
  Filled 2013-10-18: qty 2
  Filled 2013-10-18 (×2): qty 1
  Filled 2013-10-18: qty 2

## 2013-10-18 MED ORDER — IBUPROFEN 600 MG PO TABS
600.0000 mg | ORAL_TABLET | Freq: Four times a day (QID) | ORAL | Status: DC | PRN
Start: 2013-10-18 — End: 2013-10-18
  Administered 2013-10-18: 600 mg via ORAL
  Filled 2013-10-18: qty 1

## 2013-10-18 MED ORDER — LIDOCAINE HCL (PF) 1 % IJ SOLN
30.0000 mL | INTRAMUSCULAR | Status: DC | PRN
  Administered 2013-10-18: 30 mL via SUBCUTANEOUS
  Filled 2013-10-18: qty 30

## 2013-10-18 MED ORDER — DIBUCAINE 1 % RE OINT: 1.0000 "application " | TOPICAL_OINTMENT | RECTAL | Status: DC | PRN

## 2013-10-18 MED ORDER — IBUPROFEN 600 MG PO TABS
600.0000 mg | ORAL_TABLET | Freq: Four times a day (QID) | ORAL | Status: DC
  Administered 2013-10-18 – 2013-10-19 (×6): 600 mg via ORAL
  Filled 2013-10-18 (×6): qty 1

## 2013-10-18 MED ORDER — CITRIC ACID-SODIUM CITRATE 334-500 MG/5ML PO SOLN: 30.0000 mL | ORAL | Status: DC | PRN

## 2013-10-18 MED ORDER — TETANUS-DIPHTH-ACELL PERTUSSIS 5-2.5-18.5 LF-MCG/0.5 IM SUSP: 0.5000 mL | Freq: Once | INTRAMUSCULAR | Status: DC

## 2013-10-18 MED ORDER — OXYCODONE-ACETAMINOPHEN 5-325 MG PO TABS
1.0000 | ORAL_TABLET | ORAL | Status: DC | PRN
  Administered 2013-10-18: 1 via ORAL
  Filled 2013-10-18: qty 1

## 2013-10-18 MED ORDER — SIMETHICONE 80 MG PO CHEW: 80.0000 mg | CHEWABLE_TABLET | ORAL | Status: DC | PRN

## 2013-10-18 MED ORDER — OXYTOCIN BOLUS FROM INFUSION
500.0000 mL | INTRAVENOUS | Status: DC
  Administered 2013-10-18: 500 mL via INTRAVENOUS

## 2013-10-18 MED ORDER — BENZOCAINE-MENTHOL 20-0.5 % EX AERO
1.0000 | INHALATION_SPRAY | CUTANEOUS | Status: DC | PRN
Start: 2013-10-18 — End: 2013-10-19
  Filled 2013-10-18: qty 56

## 2013-10-18 MED ORDER — LANOLIN HYDROUS EX OINT: TOPICAL_OINTMENT | CUTANEOUS | Status: DC | PRN

## 2013-10-18 MED ORDER — ONDANSETRON HCL 4 MG/2ML IJ SOLN: 4.0000 mg | INTRAMUSCULAR | Status: DC | PRN

## 2013-10-18 MED ORDER — ONDANSETRON HCL 4 MG PO TABS: 4.0000 mg | ORAL_TABLET | ORAL | Status: DC | PRN

## 2013-10-18 MED ORDER — FENTANYL CITRATE 0.05 MG/ML IJ SOLN: 100.0000 ug | INTRAMUSCULAR | Status: DC | PRN

## 2013-10-18 MED ORDER — LIDOCAINE HCL (PF) 1 % IJ SOLN
INTRAMUSCULAR | Status: AC
  Filled 2013-10-18: qty 30

## 2013-10-18 MED ORDER — SODIUM CHLORIDE 0.9 % IV SOLN
1.0000 g | INTRAVENOUS | Status: DC
  Filled 2013-10-18 (×3): qty 1000

## 2013-10-18 MED ORDER — LACTATED RINGERS IV SOLN: INTRAVENOUS | Status: DC

## 2013-10-18 MED ORDER — ZOLPIDEM TARTRATE 5 MG PO TABS: 5.0000 mg | ORAL_TABLET | Freq: Every evening | ORAL | Status: DC | PRN

## 2013-10-18 MED ORDER — PRENATAL MULTIVITAMIN CH
1.0000 | ORAL_TABLET | Freq: Every day | ORAL | Status: DC
  Administered 2013-10-18 – 2013-10-19 (×2): 1 via ORAL
  Filled 2013-10-18 (×2): qty 1

## 2013-10-18 MED ORDER — ACETAMINOPHEN 325 MG PO TABS: 650.0000 mg | ORAL_TABLET | ORAL | Status: DC | PRN

## 2013-10-18 MED ORDER — LACTATED RINGERS IV SOLN: 500.0000 mL | INTRAVENOUS | Status: DC | PRN

## 2013-10-18 MED ORDER — SENNOSIDES-DOCUSATE SODIUM 8.6-50 MG PO TABS
2.0000 | ORAL_TABLET | ORAL | Status: DC
  Administered 2013-10-19: 2 via ORAL
  Filled 2013-10-18: qty 2

## 2013-10-18 MED ORDER — ONDANSETRON HCL 4 MG/2ML IJ SOLN: 4.0000 mg | Freq: Four times a day (QID) | INTRAMUSCULAR | Status: DC | PRN

## 2013-10-18 MED ORDER — DIPHENHYDRAMINE HCL 25 MG PO CAPS: 25.0000 mg | ORAL_CAPSULE | Freq: Four times a day (QID) | ORAL | Status: DC | PRN

## 2013-10-18 MED ORDER — SODIUM CHLORIDE 0.9 % IV SOLN
2.0000 g | Freq: Once | INTRAVENOUS | Status: AC
  Administered 2013-10-18: 2 g via INTRAVENOUS
  Filled 2013-10-18: qty 2000

## 2013-10-18 NOTE — H&P (Signed)
Mariah Wallace is a 28 y.o. female G3P0020 with IUP at [redacted]w[redacted]d presenting for SROM at home and painful ctx. Fetus is moving, and pt is having some spotting. [facility]  Prenatal History/Complications: Pregnancy c/w placenta succenturiata, sickle cell traint, PUPPP, GBS UTI, and first trimesterbleeding.    Past Medical History: Past Medical History  Diagnosis Date  . Urinary tract infection   . Medical history non-contributory     Past Surgical History: Past Surgical History  Procedure Laterality Date  . Dilation and curettage of uterus      Obstetrical History: OB History   Grav Para Term Preterm Abortions TAB SAB Ect Mult Living   3 0 0 0 2 2 0 0 0 0       Gynecological History: OB History   Grav Para Term Preterm Abortions TAB SAB Ect Mult Living   3 0 0 0 2 2 0 0 0 0       Social History: History   Social History  . Marital Status: Single    Spouse Name: N/A    Number of Children: N/A  . Years of Education: N/A   Social History Main Topics  . Smoking status: Former Smoker    Quit date: 03/16/2013  . Smokeless tobacco: Never Used  . Alcohol Use: No     Comment: occ  . Drug Use: No  . Sexual Activity: Yes    Birth Control/ Protection: None   Other Topics Concern  . None   Social History Narrative  . None    Family History: Family History  Problem Relation Age of Onset  . Hypertension Mother   . Cancer Paternal Uncle     Allergies: No Known Allergies  Prescriptions prior to admission  Medication Sig Dispense Refill  . feeding supplement, ENSURE COMPLETE, (ENSURE COMPLETE) LIQD Take 237 mLs by mouth 2 (two) times daily between meals.  20 Bottle  1  . folic acid (FOLVITE) 1 MG tablet Take 1 tablet (1 mg total) by mouth daily.  30 tablet  1  . ondansetron (ZOFRAN ODT) 4 MG disintegrating tablet Take 1 tablet (4 mg total) by mouth every 8 (eight) hours as needed for nausea.  20 tablet  0  . Prenatal Vit-Fe Fumarate-FA (PRENATAL  MULTIVITAMIN) TABS Take 1 tablet by mouth daily at 12 noon.         Review of Systems   Constitutional: Negative for fever, chills, weight loss, malaise/fatigue and diaphoresis.  HENT: Negative for hearing loss, ear pain, nosebleeds, congestion, sore throat, neck pain, tinnitus and ear discharge.   Eyes: Negative for blurred vision, double vision, photophobia, pain, discharge and redness.  Respiratory: Negative for cough, hemoptysis, sputum production, shortness of breath, wheezing and stridor.   Cardiovascular: Negative for chest pain, palpitations, orthopnea,  leg swelling  Gastrointestinal: Positive for abdominal pain. Negative for heartburn, nausea, vomiting, diarrhea, constipation, blood in stool Genitourinary: Negative for dysuria, urgency, frequency, hematuria and flank pain.  Musculoskeletal: Negative for myalgias, back pain, joint pain and falls.  Skin: Negative for itching and rash.  Neurological: Negative for dizziness, tingling, tremors, sensory change, speech change, focal weakness, seizures, loss of consciousness, weakness and headaches.  Endo/Heme/Allergies: Negative for environmental allergies and polydipsia. Does not bruise/bleed easily.  Psychiatric/Behavioral: Negative for depression, suicidal ideas, hallucinations, memory loss and substance abuse. The patient is not nervous/anxious and does not have insomnia.       Last menstrual period 01/21/2013. General appearance: alert, cooperative and appears stated age  Lungs: clear to auscultation bilaterally Heart: regular rate and rhythm Abdomen: soft, non-tender; bowel sounds normal Extremities: Homans sign is negative, no sign of DVT Presentation: cephalic Fetal monitoringBaseline: 120 bpm, Variability: Good {> 6 bpm), Accelerations: Reactive and Decelerations: Absent Uterine activity regular q2-3  Dilation: 7 Effacement (%): 100 Station: +1 Exam by:: Dr Leslie Andrea   Prenatal labs: ABO, Rh: O/POS/-- (07/29  1435) Antibody: NEG (07/29 1435) Rubella:   RPR: NON REAC (12/03 0948)  HBsAg: NEGATIVE (07/29 1435)  HIV: NON REACTIVE (12/03 0948)  GBS:   POS [place]  Dating dated by 8 wk [place]  Genetic Screen 1 Screen:     thcikened NT      Quad: nml                 NIPS:  Anatomic [place]  Nml [place] - MFM rec's for AFI/NST testing at 32 weeks (succincurate lobe of anterior placenta)  GTT Early:               Third trimester: 42  TDaP vaccine   12/11  Flu vaccine  declined  GBS  GBS Positive  Baby Food  breast  Contraception  mirena  Circumcision  Female - [place]  Pediatrician     Prenatal Transfer Tool  Maternal Diabetes: No Genetic Screening: Abnormal:  Results: Other:thickend NT nml quad Maternal Ultrasounds/Referrals: Abnormal:  Findings:   Other: placenta succenturiata Fetal Ultrasounds or other Referrals:  None Maternal Substance Abuse:  No Significant Maternal Medications:  None Significant Maternal Lab Results: Lab values include: Group B Strep positive      No results found for this or any previous visit (from the past 24 hour(s)).  Assessment: Mariah Wallace is a 28 y.o. G3P0020 at [redacted]w[redacted]d by R=8 here for SOOL with SROM #Labor: pt in active labor will monitor for cervical change #Pain: Desires epidural. May have IV pain meds in interim #FWB: Reactive and pushing constant monitoring. #ID:  GBS+ - start ampicillin 2g now then 1g q4hr #MOF: breast #MOC: mirena #elevated BP: likely related to pain, if persistently elevated will collect labs.  Leamon Arnt RYAN 10/18/2013, 1:33 AM

## 2013-10-18 NOTE — MAU Note (Signed)
PT ARRIVED VIA EMS  WITH SROM AT 0030- CLEAR-  GROSSLY SROM IN MAU.   VERY UNCOMFORTABLE.  SAYS WAS BREECH BUT LAST VISIT WAS VTX.   FHR-  129.

## 2013-10-18 NOTE — Progress Notes (Signed)
Ur chart review completed.  

## 2013-10-18 NOTE — Progress Notes (Cosign Needed)
Post Partum Day 0 (Delivery at 0145) Subjective: Pt report abdominal pain and vaginal bleeding.  States the vaginal bleeding varies in amount, with an amount slightly heavier than a period at the heaviest.  No other complaints.  Objective: Blood pressure 148/77, pulse 61, temperature 97.8 F (36.6 C), temperature source Oral, resp. rate 18, height 5\' 2"  (1.575 m), weight 61.689 kg (136 lb), last menstrual period 01/21/2013, SpO2 99.00%, unknown if currently breastfeeding.  Physical Exam:  General: alert, cooperative and appears stated age Lochia: appropriate Uterine Fundus: firm Incision: n/a DVT Evaluation: No evidence of DVT seen on physical exam.   Recent Labs  10/18/13 0240  HGB 11.6*  HCT 33.5*    Assessment/Plan: Plan for discharge tomorrow.  Pt is GBS+ and did not receive abx prior to delivery due to rapid progression.  The baby with be observed for the next 48 hrs.  Plan for rooming in, if the patient is stable.   LOS: 0 days   Mariah Wallace 10/18/2013, 7:52 AM

## 2013-10-18 NOTE — Lactation Note (Signed)
This note was copied from the chart of Mariah Wallace. Lactation Consultation Note  Patient Name: Mariah Wallace Today's Date: 10/18/2013  [name] is 90 hours old, late pre-term and birth weight is 5lbs 7.7 oz. Mom reports baby is sleepy and refuses to latch on for the last 2 attempts. Mom's breasts are filling and she is able to hand express colostrum. Attempted to latch baby to breast, but baby will not open mouth wide enough to latch. Reviewed waking techniques with mom and reviewed football and cross-cradle position. Assisted father of baby and mother to hand express colostrum and spoon feed baby a total of 56mls. Gave mom a hand pump with instruction. Plan is for mom to hand express or hand pump to get colostrum flowing, then offer breast to baby. After attempting to latch, mom will post hand express and/or hand pump to stimulate breast and provide colostrum to spoon feed baby. Mom enc to offer STS at 2.5 hours, and look for feeding cues. Due to baby's weight and early gestation, mom enc to feed baby every 3 hours. Reviewed basics of breastfeeding. Left [place] breastfeeding brochure with mom and she is aware of OP/BFSG services. Mom enc to call out for assistance if needed.    Maternal Data    Feeding Feeding Type: Breast Fed Length of feed: 0 min  LATCH Score/Interventions                      Lactation Tools Discussed/Used     Consult Status      [name] 10/18/2013, 2:35 PM

## 2013-10-19 MED ORDER — IBUPROFEN 600 MG PO TABS: 600.0000 mg | ORAL_TABLET | Freq: Four times a day (QID) | ORAL | Status: DC

## 2013-10-19 NOTE — Discharge Summary (Signed)
Obstetric Discharge Summary Reason for Admission: onset of labor Prenatal Procedures: none Intrapartum Procedures: spontaneous vaginal delivery and GBS prophylaxis not received Postpartum Procedures: none Complications-Operative and Postpartum: 1st degree perineal laceration Hemoglobin  Date Value Range Status  10/18/2013 11.6* 12.0 - 15.0 g/dL Final     HCT  Date Value Range Status  10/18/2013 33.5* 36.0 - 46.0 % Final    Physical Exam:  General: alert, cooperative and appears stated age 27: appropriate Uterine Fundus: firm Incision: not assessed DVT Evaluation: No evidence of DVT seen on physical exam.  Discharge Diagnoses: Term Pregnancy-delivered  Discharge Information: Date: 10/19/2013 Activity: pelvic rest Diet: routine Medications: PNV and Ibuprofen Condition: stable Instructions: refer to practice specific booklet Discharge to: home   Newborn Data: Live born female  Birth Weight: 5 lb 7.7 oz (2486 g) APGAR: 9, 9  Observation for next 24 hrs due to no GBS prophylaxis not received by GBS+ mother.  Mariah Wallace is a 28 yo who progressed rapidly to a NSVD at [redacted]w[redacted]d.  The patient was not prophylaxed for GBS+ because of the rate of progression.  She receive local anesthesia, has a 1st degree laceration repaired with 3.0 monocryl with an EBL of 250 cc.  APGAR -,9 with the baby girl being observed for the next 24 hrs. Mother is breast feeding and chose the Mirena for contraceptive measures. Pt had an elevated BP at delivery and then a single elevated BP this morning of 164/93. She will have a BP recheck prior to discharge, and will also have a [facility] RN visit to f/u on BP.   [place], [place] 10/19/2013, 7:47 AM  I have seen and examined this patient and I agree with the above. Mariah Wallace 8:43 AM 10/19/2013

## 2013-10-19 NOTE — H&P (Signed)
Attestation of Attending Supervision of Fellow: Evaluation and management procedures were performed by the Fellow under my supervision and collaboration.  I have reviewed the Fellow's note and chart, and I agree with the management and plan.    

## 2013-10-19 NOTE — Discharge Instructions (Signed)

## 2013-10-20 ENCOUNTER — Other Ambulatory Visit: Payer: Medicaid Other

## 2013-10-27 ENCOUNTER — Ambulatory Visit (HOSPITAL_COMMUNITY): Payer: Medicaid Other

## 2013-10-27 ENCOUNTER — Ambulatory Visit (HOSPITAL_COMMUNITY): Admission: RE | Admit: 2013-10-27 | Payer: Medicaid Other | Source: Ambulatory Visit

## 2013-10-30 ENCOUNTER — Encounter: Payer: Self-pay | Admitting: Family Medicine

## 2013-12-21 IMAGING — US US OB FOLLOW-UP
2 series · 12 of 28 positions shown · non-contrast
Comparison: none

[Series 1: us ob follow up · 2 of 5 slices shown (1 of 2)]
[im 2/5]
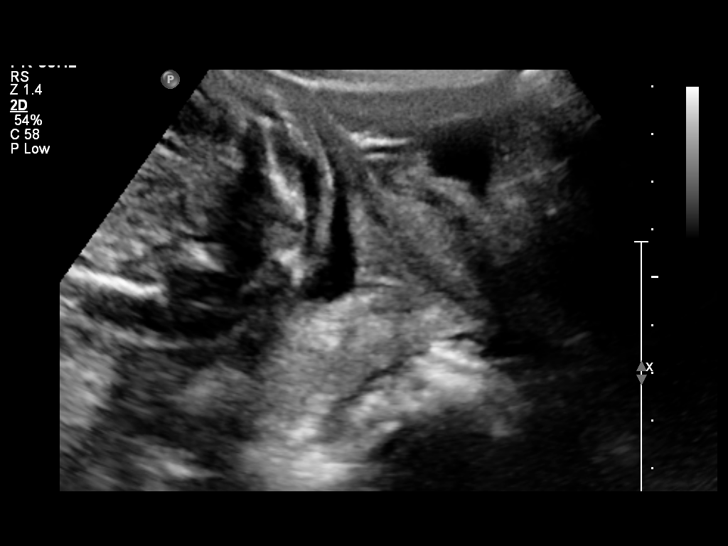
[im 5/5]
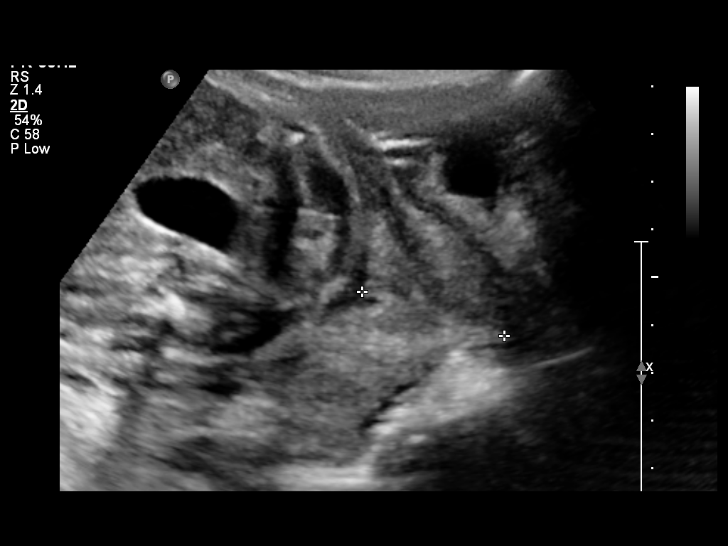

[Series 1: us ob follow up · 10 of 35 slices shown (2 of 2)]
[im 2/35]
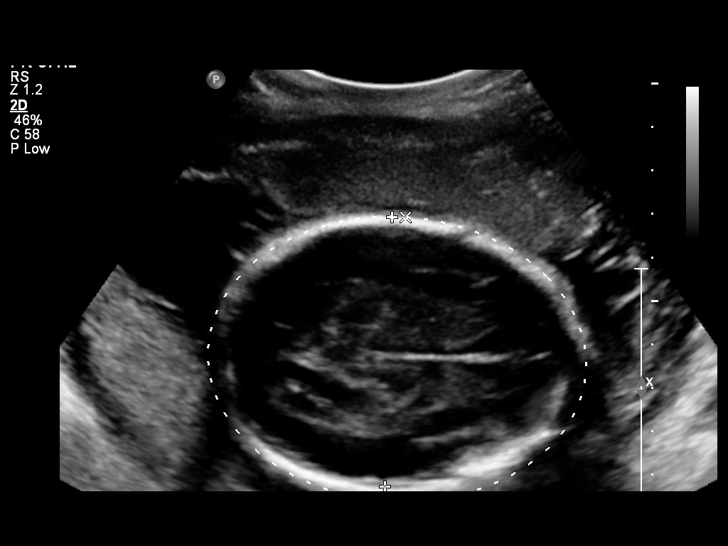
[im 6/35]
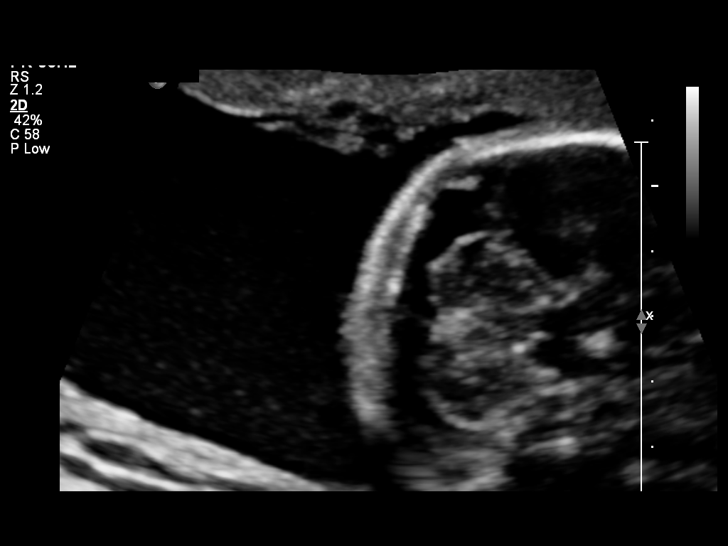
[im 9/35]
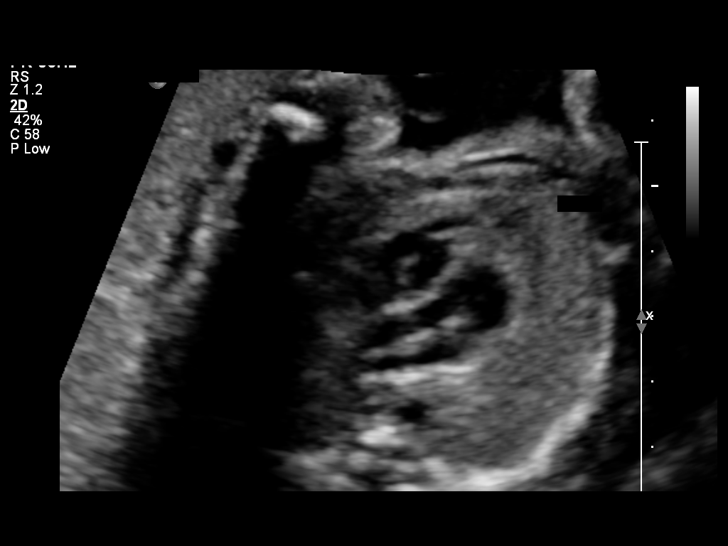
[im 12/35]
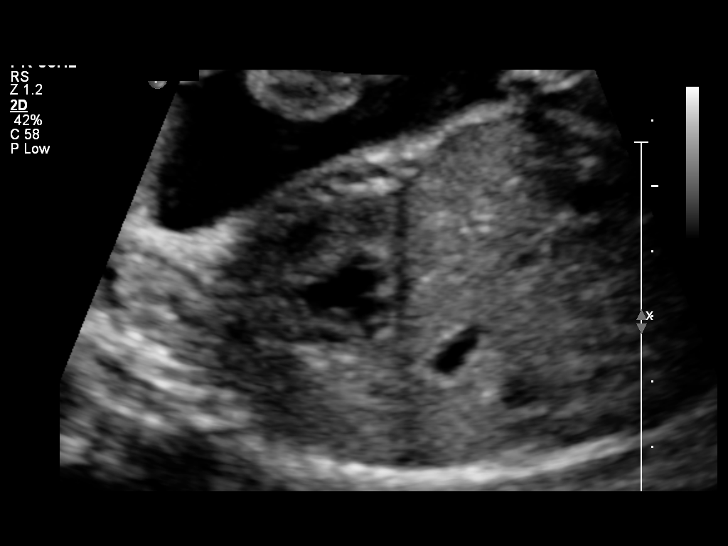
[im 17/35]
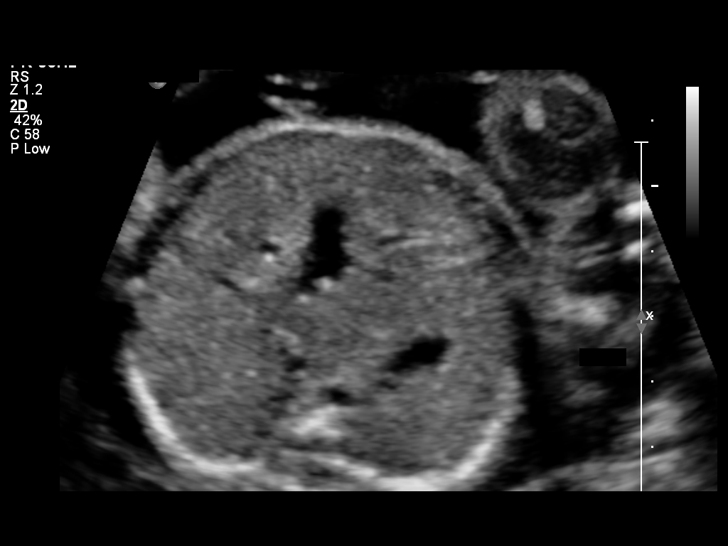
[im 20/35]
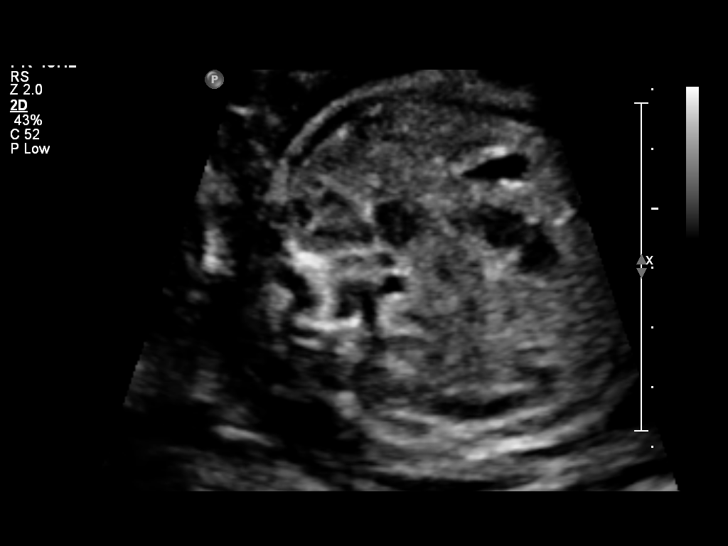
[im 23/35]
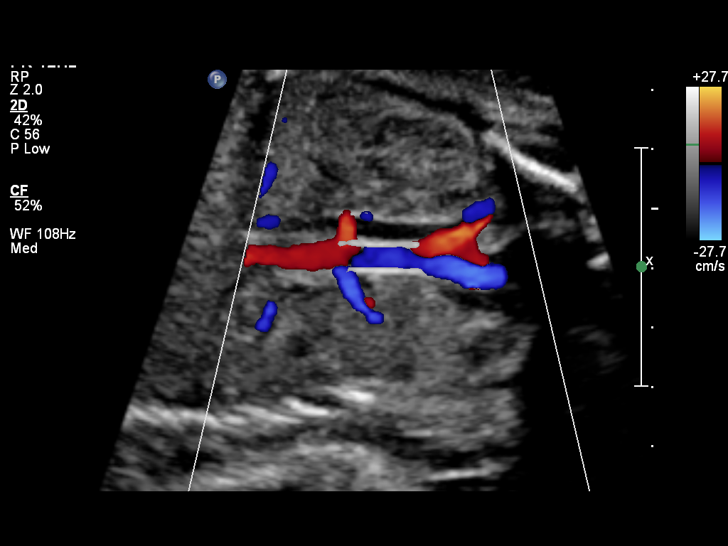
[im 27/35]
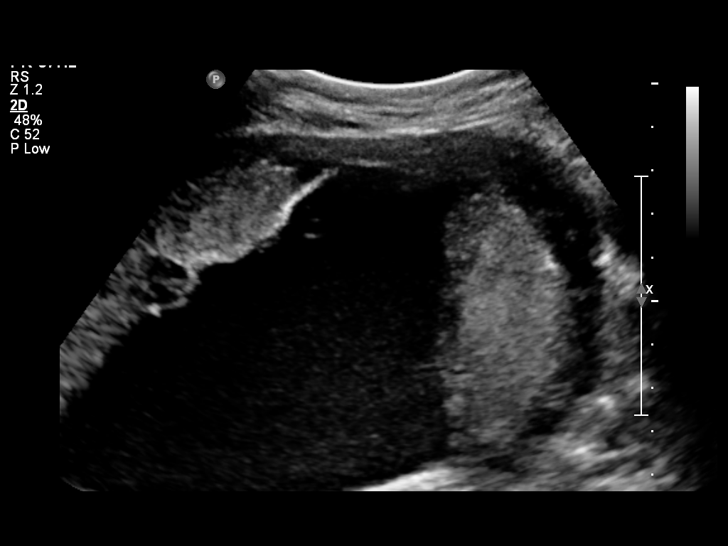
[im 30/35]
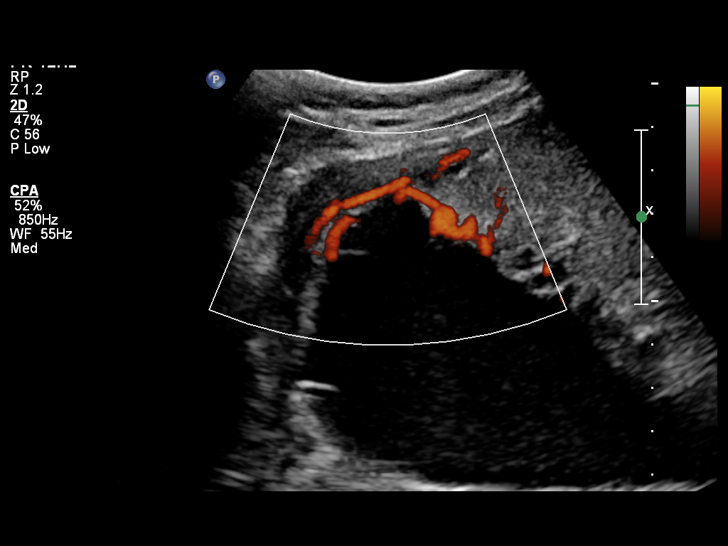
[im 33/35]
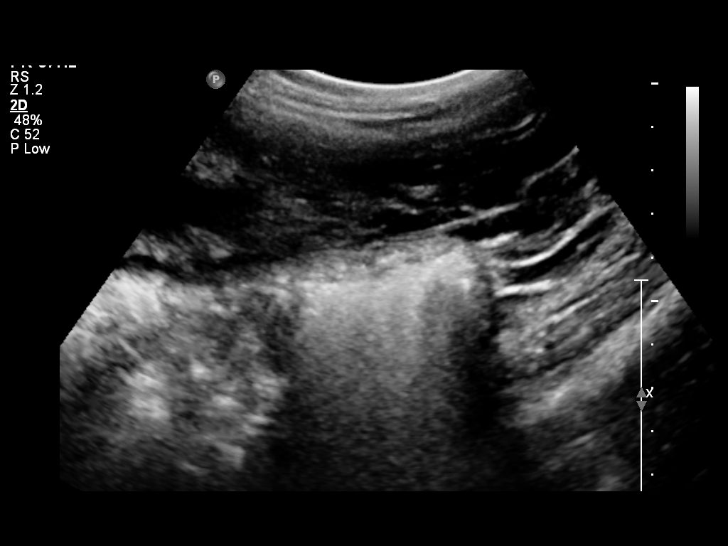

[12 of 28 positions shown; findings below may reference images not displayed]

OBSTETRICS REPORT
                      (Signed Final 07/28/2013 [DATE])

Service(s) Provided

 US OB FOLLOW UP                                       76816.1
Indications

 Sickle cell disease
 Size Tarab
Fetal Evaluation

 Num Of Fetuses:    1
 Fetal Heart Rate:  146                          bpm
 Cardiac Activity:  Observed
 Presentation:      Breech
 Placenta:          Succenturiate lobe
 P. Cord            Previously Visualized
 Insertion:

 Amniotic Fluid
 AFI FV:      Subjectively within normal limits
                                             Larg Pckt:    5.15  cm
 RUQ:   5.15    cm
Biometry

 BPD:     61.7  mm     G. Age:  25w 1d                CI:         67.4   70 - 86
                                                      FL/HC:      19.7   18.6 -

 HC:     240.6  mm     G. Age:  26w 1d       41  %    HC/AC:      1.14   1.04 -

 AC:     211.4  mm     G. Age:  25w 5d       40  %    FL/BPD:     76.8   71 - 87
 FL:      47.4  mm     G. Age:  25w 6d       40  %    FL/AC:      22.4   20 - 24
 HUM:     42.7  mm     G. Age:  25w 4d       42  %

 Est. FW:     846  gm    1 lb 14 oz      54  %
Gestational Age

 LMP:           26w 6d        Date:  01/21/13                 EDD:   10/28/13
 U/S Today:     25w 5d                                        EDD:   11/05/13
 Best:          25w 5d     Det. By:  U/S C R L (03/30/13)     EDD:   11/05/13
Anatomy
 Cranium:          Appears normal         Ductal Arch:      Previously seen
 Fetal Cavum:      Appears normal         Diaphragm:        Appears normal
 Ventricles:       Appears normal         Stomach:          Appears normal, left
                                                            sided
 Choroid Plexus:   Previously seen        Abdomen:          Appears normal
 Cerebellum:       Appears normal         Abdominal Wall:   Previously seen
 Posterior Fossa:  Appears normal         Cord Vessels:     Previously seen
 Face:             Orbits previously      Kidneys:          Appear normal
                   seen
 Lips:             Previously seen        Bladder:          Appears normal
 Heart:            Appears normal         Spine:            Previously seen
                   (4CH, axis, and
                   situs)
 RVOT:             Appears normal         Lower             Previously seen
                                          Extremities:
 LVOT:             Appears normal         Upper             Previously seen
                                          Extremities:
 Aortic Arch:      Previously seen

 Other:  Fetus appears to be a female.
Targeted Anatomy

 Fetal Central Nervous System
 Lat. Ventricles:
Cervix Uterus Adnexa

 Cervical Length:    3.11     cm

 Cervix:       Normal appearance by transabdominal scan.
 Uterus:       No abnormality visualized.
 Cul De Sac:   No free fluid seen.

 Adnexa:     No abnormality visualized.
Impression

 Single IUP at 25 [DATE] weeks in gestation complicated by sickle
 cell disease
 EFW 54th percentile
 Normal interval anatomic survey
 Normal amniotic fluid volume
 No previa but a succenturiate lobe of the anterior placenta is
 seen implanted along the uterine fundus
Recommendations

 Monthly interval growth (next in 4 weeks)
 Begin antenatal testing at 32 weeks

 questions or concerns.

## 2014-06-06 ENCOUNTER — Encounter (HOSPITAL_COMMUNITY): Payer: Self-pay | Admitting: Emergency Medicine

## 2014-06-06 ENCOUNTER — Emergency Department (HOSPITAL_COMMUNITY)
Admission: EM | Admit: 2014-06-06 | Discharge: 2014-06-06 | Disposition: A | Discharge status: Home / Self Care | Payer: Medicaid Other | Attending: Emergency Medicine | Admitting: Emergency Medicine

## 2014-06-06 ENCOUNTER — Emergency Department (HOSPITAL_COMMUNITY): Payer: Medicaid Other

## 2014-06-06 DIAGNOSIS — N939 Abnormal uterine and vaginal bleeding, unspecified: Secondary | ICD-10-CM

## 2014-06-06 DIAGNOSIS — Z87891 Personal history of nicotine dependence: Secondary | ICD-10-CM | POA: Insufficient documentation

## 2014-06-06 DIAGNOSIS — N898 Other specified noninflammatory disorders of vagina: Secondary | ICD-10-CM | POA: Diagnosis present

## 2014-06-06 DIAGNOSIS — Z87440 Personal history of urinary (tract) infections: Secondary | ICD-10-CM | POA: Diagnosis not present

## 2014-06-06 DIAGNOSIS — Z3202 Encounter for pregnancy test, result negative: Secondary | ICD-10-CM | POA: Insufficient documentation

## 2014-06-06 LAB — URINALYSIS W MICROSCOPIC (NOT AT ARMC)
Bilirubin Urine: NEGATIVE
GLUCOSE, UA: NEGATIVE mg/dL
HGB URINE DIPSTICK: NEGATIVE
KETONES UR: NEGATIVE mg/dL
Nitrite: NEGATIVE
PROTEIN: NEGATIVE mg/dL
Specific Gravity, Urine: 1.017 (ref 1.005–1.030)
Urobilinogen, UA: 0.2 mg/dL (ref 0.0–1.0)
pH: 5.5 (ref 5.0–8.0)

## 2014-06-06 LAB — CBC WITH DIFFERENTIAL/PLATELET
Basophils Absolute: 0 10*3/uL (ref 0.0–0.1)
Basophils Relative: 0 % (ref 0–1)
EOS ABS: 0.1 10*3/uL (ref 0.0–0.7)
EOS PCT: 2 % (ref 0–5)
HEMATOCRIT: 30.9 % — AB (ref 36.0–46.0)
HEMOGLOBIN: 10.8 g/dL — AB (ref 12.0–15.0)
LYMPHS ABS: 1.4 10*3/uL (ref 0.7–4.0)
LYMPHS PCT: 28 % (ref 12–46)
MCH: 27.3 pg (ref 26.0–34.0)
MCHC: 35 g/dL (ref 30.0–36.0)
MCV: 78.2 fL (ref 78.0–100.0)
MONO ABS: 0.3 10*3/uL (ref 0.1–1.0)
MONOS PCT: 6 % (ref 3–12)
Neutro Abs: 3.2 10*3/uL (ref 1.7–7.7)
Neutrophils Relative %: 64 % (ref 43–77)
Platelets: 193 10*3/uL (ref 150–400)
RBC: 3.95 MIL/uL (ref 3.87–5.11)
RDW: 12.9 % (ref 11.5–15.5)
WBC: 5 10*3/uL (ref 4.0–10.5)

## 2014-06-06 LAB — WET PREP, GENITAL
Clue Cells Wet Prep HPF POC: NONE SEEN
Trich, Wet Prep: NONE SEEN
WBC, Wet Prep HPF POC: NONE SEEN
Yeast Wet Prep HPF POC: NONE SEEN

## 2014-06-06 LAB — HIV ANTIBODY (ROUTINE TESTING W REFLEX): HIV 1&2 Ab, 4th Generation: NONREACTIVE

## 2014-06-06 LAB — PREGNANCY, URINE: PREG TEST UR: NEGATIVE

## 2014-06-06 LAB — RPR

## 2014-06-06 MED ORDER — METHYLERGONOVINE MALEATE 0.2 MG PO TABS: 0.2000 mg | ORAL_TABLET | Freq: Three times a day (TID) | ORAL | Status: DC

## 2014-06-06 NOTE — ED Notes (Signed)
Pt c/o lower abd pain and passing blood clots; pt sts had elective abortion last month

## 2014-06-06 NOTE — Discharge Instructions (Signed)
Take the methergine as prescribed. You can take aleve 2 tabs OTC for pain. If you seem worse or are not improving while taking the medication, go to [facility] to be rechecked.

## 2014-06-06 NOTE — ED Provider Notes (Signed)
CSN: [id number]     Arrival date & time 06/06/14  0854 History   First MD Initiated Contact with Patient 06/06/14 [phone]     Chief Complaint  Patient presents with  . Vaginal Bleeding     (Consider location/radiation/quality/duration/timing/severity/associated sxs/prior Treatment) HPI Patient is G3 P1 Ab2, states she had a abortion done around August 9 in [place]. She states she was [redacted] weeks pregnant. She reports she had minimal bleeding after the abortion. She states this morning she started having heavy bleeding, that is heavier than  her usual period. She also reports she is passing large blood clots. She's been unable to control the bleeding and states she's bleeding beyond her pads. She states she has lower abdominal cramping that is bilateral and not localized. She complains of feeling dizzy and lightheaded today especially while she's walking. She states she's never had this happen before.   PCP none GYN none  Past Medical History  Diagnosis Date  . Urinary tract infection   . Medical history non-contributory    Past Surgical History  Procedure Laterality Date  . Dilation and curettage of uterus     Family History  Problem Relation Age of Onset  . Hypertension Mother   . Cancer Paternal Uncle    History  Substance Use Topics  . Smoking status: Former Smoker    Quit date: 03/16/2013  . Smokeless tobacco: Never Used  . Alcohol Use: No     Comment: occ   Not working since she broke her LRF 8/20  OB History   Grav Para Term Preterm Abortions TAB SAB Ect Mult Living   3 1 1  0 2 2 0 0 0 1     Review of Systems  All other systems reviewed and are negative.     Allergies  Review of patient's allergies indicates no known allergies.  Home Medications   Prior to Admission medications   Medication Sig Start Date End Date Taking? Authorizing Provider  methylergonovine (METHERGINE) 0.2 MG tablet Take 1 tablet (0.2 mg total) by mouth 3 (three) times daily. 06/06/14    Janice Norrie, MD   BP 110/76  Pulse 82  Temp(Src) 98.2 F (36.8 C) (Oral)  Resp 18  SpO2 98%  Vital signs normal   Physical Exam  Nursing note and vitals reviewed. Constitutional: She is oriented to person, place, and time. She appears well-developed and well-nourished.  Non-toxic appearance. She does not appear ill. No distress.  HENT:  Head: Normocephalic and atraumatic.  Right Ear: External ear normal.  Left Ear: External ear normal.  Nose: Nose normal. No mucosal edema or rhinorrhea.  Mouth/Throat: Oropharynx is clear and moist and mucous membranes are normal. No dental abscesses or uvula swelling.  Eyes: Conjunctivae and EOM are normal. Pupils are equal, round, and reactive to light.  Neck: Normal range of motion and full passive range of motion without pain. Neck supple.  Cardiovascular: Normal rate, regular rhythm and normal heart sounds.  Exam reveals no gallop and no friction rub.   No murmur heard. Pulmonary/Chest: Effort normal and breath sounds normal. No respiratory distress. She has no wheezes. She has no rhonchi. She has no rales. She exhibits no tenderness and no crepitus.  Abdominal: Soft. Normal appearance and bowel sounds are normal. She exhibits no distension. There is tenderness. There is no rebound and no guarding.    Genitourinary:  Patient has normal external genitalia. There is moderate blood in her vault but she is not overflowing her vaginal  vault. Her uterus feels normal size and is minimally tender. Her right adnexa is nontender and not enlarged. She has some minor tenderness to her left adnexa without masses.  Musculoskeletal: Normal range of motion. She exhibits no edema and no tenderness.  Moves all extremities well.   Neurological: She is alert and oriented to person, place, and time. She has normal strength. No cranial nerve deficit.  Skin: Skin is warm, dry and intact. No rash noted. No erythema. No pallor.  Psychiatric: She has a normal mood and  affect. Her speech is normal and behavior is normal. Her mood appears not anxious.    ED Course  Procedures (including critical care time)  Review of prior testing shows patient is O+.   13:19 D/W Dr Myer Peer, GYN who reviewed her [place], oral methergine 0.2 mg TID x 3d and to go to [facility] if she has further problems.   Labs Review Results for orders placed during the hospital encounter of 06/06/14  WET PREP, GENITAL      Result Value Ref Range   Yeast Wet Prep HPF POC NONE SEEN  NONE SEEN   Trich, Wet Prep NONE SEEN  NONE SEEN   Clue Cells Wet Prep HPF POC NONE SEEN  NONE SEEN   WBC, Wet Prep HPF POC NONE SEEN  NONE SEEN  CBC WITH DIFFERENTIAL      Result Value Ref Range   WBC 5.0  4.0 - 10.5 K/uL   RBC 3.95  3.87 - 5.11 MIL/uL   Hemoglobin 10.8 (*) 12.0 - 15.0 g/dL   HCT 30.9 (*) 36.0 - 46.0 %   MCV 78.2  78.0 - 100.0 fL   MCH 27.3  26.0 - 34.0 pg   MCHC 35.0  30.0 - 36.0 g/dL   RDW 12.9  11.5 - 15.5 %   Platelets 193  150 - 400 K/uL   Neutrophils Relative % 64  43 - 77 %   Neutro Abs 3.2  1.7 - 7.7 K/uL   Lymphocytes Relative 28  12 - 46 %   Lymphs Abs 1.4  0.7 - 4.0 K/uL   Monocytes Relative 6  3 - 12 %   Monocytes Absolute 0.3  0.1 - 1.0 K/uL   Eosinophils Relative 2  0 - 5 %   Eosinophils Absolute 0.1  0.0 - 0.7 K/uL   Basophils Relative 0  0 - 1 %   Basophils Absolute 0.0  0.0 - 0.1 K/uL  URINALYSIS W MICROSCOPIC      Result Value Ref Range   Color, Urine YELLOW  YELLOW   APPearance CLEAR  CLEAR   Specific Gravity, Urine 1.017  1.005 - 1.030   pH 5.5  5.0 - 8.0   Glucose, UA NEGATIVE  NEGATIVE mg/dL   Hgb urine dipstick NEGATIVE  NEGATIVE   Bilirubin Urine NEGATIVE  NEGATIVE   Ketones, ur NEGATIVE  NEGATIVE mg/dL   Protein, ur NEGATIVE  NEGATIVE mg/dL   Urobilinogen, UA 0.2  0.0 - 1.0 mg/dL   Nitrite NEGATIVE  NEGATIVE   Leukocytes, UA SMALL (*) NEGATIVE   WBC, UA 0-2  <3 WBC/hpf   Squamous Epithelial / LPF RARE  RARE   Urine-Other LESS THAN 10 mL  OF URINE SUBMITTED    PREGNANCY, URINE      Result Value Ref Range   Preg Test, Ur NEGATIVE  NEGATIVE   Laboratory interpretation all normal except mild anemia     Imaging Review US Transvaginal Non-ob  US  Pelvis Complete  06/06/2014   CLINICAL DATA:  Status post recent abortion with heavy vaginal bleeding  EXAM: TRANSABDOMINAL AND TRANSVAGINAL ULTRASOUND OF PELVIS  TECHNIQUE: Both transabdominal and transvaginal ultrasound examinations of the pelvis were performed. Transabdominal technique was performed for global imaging of the pelvis including uterus, ovaries, adnexal regions, and pelvic cul-de-sac. It was necessary to proceed with endovaginal exam following the transabdominal exam to visualize the endometrium.  COMPARISON:  None  FINDINGS: Uterus  Measurements: 8.1 x 4.4 x 5.4 cm. No fibroids or other mass visualized.  Endometrium  Thickness: 18 mm. No significant increased vascularity is noted within the thickened endometrium although these changes may represent residual thrombus or possible retained products of conception given the heavy vaginal bleeding.  Right ovary  Measurements: 2.9 x 1.5 x 2.7 cm. Normal appearance/no adnexal mass.  Left ovary  Measurements: 2.9 x 1.8 x 3.7 cm. A complex cystic lesion is noted within measuring 1.7 cm. This may simply represent a hemorrhagic cyst.  Other findings  No free fluid.  IMPRESSION: Prominent endometrium which likely represents some residual thrombus and possible retained products of conception. No significant increased vascularity is noted in this region however.  Likely hemorrhagic cyst in the left ovary.   Electronically Signed   By: Inez Catalina M.D.   On: 06/06/2014 12:30     IMPRESSION Prominent endometrium which likely represents some residual thrombus and possible retained products of conception. No significant increased vascularity is noted in this region however.Likely hemorrhagic cyst in the left ovary.   EKG Interpretation None       MDM   Final diagnoses:  Vaginal bleeding    New Prescriptions   METHYLERGONOVINE (METHERGINE) 0.2 MG TABLET    Take 1 tablet (0.2 mg total) by mouth 3 (three) times daily.    Plan discharge   Rolland Porter, MD, Alanson Aly, MD 06/06/14 [phone]

## 2014-06-07 LAB — GC/CHLAMYDIA PROBE AMP
CT PROBE, AMP APTIMA: NEGATIVE
GC PROBE AMP APTIMA: NEGATIVE

## 2014-07-13 ENCOUNTER — Other Ambulatory Visit: Payer: Self-pay

## 2014-07-30 ENCOUNTER — Encounter (HOSPITAL_COMMUNITY): Payer: Self-pay | Admitting: Emergency Medicine

## 2014-08-30 ENCOUNTER — Encounter: Payer: Self-pay | Admitting: Obstetrics & Gynecology

## 2015-09-12 ENCOUNTER — Emergency Department (HOSPITAL_COMMUNITY): Payer: Medicaid Other

## 2015-09-12 ENCOUNTER — Emergency Department (HOSPITAL_COMMUNITY)
Admission: EM | Admit: 2015-09-12 | Discharge: 2015-09-12 | Disposition: A | Discharge status: Home / Self Care | Payer: Self-pay | Attending: Emergency Medicine | Admitting: Emergency Medicine

## 2015-09-12 ENCOUNTER — Encounter (HOSPITAL_COMMUNITY): Payer: Self-pay | Admitting: Neurology

## 2015-09-12 DIAGNOSIS — E876 Hypokalemia: Secondary | ICD-10-CM

## 2015-09-12 DIAGNOSIS — Z3202 Encounter for pregnancy test, result negative: Secondary | ICD-10-CM | POA: Insufficient documentation

## 2015-09-12 DIAGNOSIS — N898 Other specified noninflammatory disorders of vagina: Secondary | ICD-10-CM

## 2015-09-12 DIAGNOSIS — R300 Dysuria: Secondary | ICD-10-CM

## 2015-09-12 DIAGNOSIS — F17200 Nicotine dependence, unspecified, uncomplicated: Secondary | ICD-10-CM | POA: Insufficient documentation

## 2015-09-12 DIAGNOSIS — D649 Anemia, unspecified: Secondary | ICD-10-CM

## 2015-09-12 DIAGNOSIS — N390 Urinary tract infection, site not specified: Secondary | ICD-10-CM

## 2015-09-12 DIAGNOSIS — R350 Frequency of micturition: Secondary | ICD-10-CM

## 2015-09-12 DIAGNOSIS — R109 Unspecified abdominal pain: Secondary | ICD-10-CM

## 2015-09-12 DIAGNOSIS — N760 Acute vaginitis: Secondary | ICD-10-CM

## 2015-09-12 DIAGNOSIS — R52 Pain, unspecified: Secondary | ICD-10-CM

## 2015-09-12 DIAGNOSIS — Z79899 Other long term (current) drug therapy: Secondary | ICD-10-CM | POA: Insufficient documentation

## 2015-09-12 DIAGNOSIS — B9689 Other specified bacterial agents as the cause of diseases classified elsewhere: Secondary | ICD-10-CM

## 2015-09-12 LAB — CBC
HEMATOCRIT: 33.7 % — AB (ref 36.0–46.0)
HEMOGLOBIN: 11.5 g/dL — AB (ref 12.0–15.0)
MCH: 26.9 pg (ref 26.0–34.0)
MCHC: 34.1 g/dL (ref 30.0–36.0)
MCV: 78.9 fL (ref 78.0–100.0)
Platelets: 173 10*3/uL (ref 150–400)
RBC: 4.27 MIL/uL (ref 3.87–5.11)
RDW: 12.8 % (ref 11.5–15.5)
WBC: 5.5 10*3/uL (ref 4.0–10.5)

## 2015-09-12 LAB — URINE MICROSCOPIC-ADD ON

## 2015-09-12 LAB — POC URINE PREG, ED: PREG TEST UR: NEGATIVE

## 2015-09-12 LAB — URINALYSIS, ROUTINE W REFLEX MICROSCOPIC
Bilirubin Urine: NEGATIVE
Glucose, UA: NEGATIVE mg/dL
Ketones, ur: NEGATIVE mg/dL
Nitrite: POSITIVE — AB
PROTEIN: NEGATIVE mg/dL
Specific Gravity, Urine: 1.018 (ref 1.005–1.030)
pH: 6 (ref 5.0–8.0)

## 2015-09-12 LAB — BASIC METABOLIC PANEL
ANION GAP: 7 (ref 5–15)
BUN: 9 mg/dL (ref 6–20)
CALCIUM: 9.4 mg/dL (ref 8.9–10.3)
CHLORIDE: 106 mmol/L (ref 101–111)
CO2: 26 mmol/L (ref 22–32)
Creatinine, Ser: 0.87 mg/dL (ref 0.44–1.00)
GFR calc non Af Amer: >60 mL/min (ref >60)
Glucose, Bld: 92 mg/dL (ref 65–99)
Potassium: 3.2 mmol/L — ABNORMAL LOW (ref 3.5–5.1)
Sodium: 139 mmol/L (ref 135–145)

## 2015-09-12 LAB — WET PREP, GENITAL
Sperm: NONE SEEN
Trich, Wet Prep: NONE SEEN
YEAST WET PREP: NONE SEEN

## 2015-09-12 MED ORDER — KETOROLAC TROMETHAMINE 30 MG/ML IJ SOLN
30.0000 mg | Freq: Once | INTRAMUSCULAR | Status: DC
Start: 2015-09-12 — End: 2015-09-12

## 2015-09-12 MED ORDER — AZITHROMYCIN 250 MG PO TABS
1000.0000 mg | ORAL_TABLET | Freq: Once | ORAL | Status: AC
  Administered 2015-09-12: 1000 mg via ORAL
  Filled 2015-09-12: qty 4

## 2015-09-12 MED ORDER — SULFAMETHOXAZOLE-TRIMETHOPRIM 800-160 MG PO TABS: 1.0000 | ORAL_TABLET | Freq: Two times a day (BID) | ORAL | Status: DC

## 2015-09-12 MED ORDER — LIDOCAINE HCL (PF) 1 % IJ SOLN
INTRAMUSCULAR | Status: AC
  Administered 2015-09-12: 5 mL
  Filled 2015-09-12: qty 5

## 2015-09-12 MED ORDER — SODIUM CHLORIDE 0.9 % IV BOLUS (SEPSIS): 1000.0000 mL | Freq: Once | INTRAVENOUS | Status: DC

## 2015-09-12 MED ORDER — CEFTRIAXONE SODIUM 250 MG IJ SOLR
250.0000 mg | Freq: Once | INTRAMUSCULAR | Status: AC
  Administered 2015-09-12: 250 mg via INTRAMUSCULAR
  Filled 2015-09-12: qty 250

## 2015-09-12 MED ORDER — KETOROLAC TROMETHAMINE 30 MG/ML IJ SOLN
30.0000 mg | Freq: Once | INTRAMUSCULAR | Status: AC
  Administered 2015-09-12: 30 mg via INTRAMUSCULAR
  Filled 2015-09-12: qty 1

## 2015-09-12 MED ORDER — METRONIDAZOLE 500 MG PO TABS: 500.0000 mg | ORAL_TABLET | Freq: Two times a day (BID) | ORAL | Status: DC

## 2015-09-12 NOTE — ED Provider Notes (Signed)
CSN: [id number]     Arrival date & time 09/12/15  1210 History   First MD Initiated Contact with Patient 09/12/15 1248     Chief Complaint  Patient presents with  . Flank Pain     (Consider location/radiation/quality/duration/timing/severity/associated sxs/prior Treatment) HPI Comments: Mariah Wallace is a 29 y.o. female with a PMHx of recurrent UTIs, who presents to the ED with complaints of gradual onset right flank pain that was initially intermittent for the last 2 weeks has become more constant 3 days ago. Patient describes the pain is 7/10 constant right flank pain radiating into the right lower quadrant/suprapubic area, sharp, worse with holding her urine and standing, and unrelieved with Aleve, ibuprofen, and her mother's tramadol. She states that initially she had nausea and 1 episode of NBNB emesis, last episode was on Sunday, with no ongoing nausea or vomiting. She also initially had hematuria has since resolved. She continues to have dysuria and urinary frequency. One week ago she developed white vaginal discharge.  She denies any fevers, chills, chest pain, shortness breath, ongoing nausea or vomiting, diarrhea, constipation, obstipation, melena, hematochezia, ongoing hematuria, vaginal bleeding, vaginal itching, genital lesions, numbness, tingling, or weakness. She denies any recent travel, sick contacts, suspicious food intake, alcohol use, or chronic NSAID use. LMP 11/29. Sexually active with one female partner, unprotected.  Patient is a 29 y.o. female presenting with flank pain. The history is provided by the patient and medical records. No language interpreter was used.  Flank Pain This is a new problem. The current episode started 1 to 4 weeks ago. The problem occurs constantly. The problem has been gradually worsening (initially intermittent, now more constant). Associated symptoms include abdominal pain (from flank) and urinary symptoms. Pertinent negatives include no  arthralgias, chest pain, chills, fever, myalgias, nausea (none ongoing, last episode Sunday), numbness, vomiting (none ongoing, last episode Sunday) or weakness. The symptoms are aggravated by standing (and holding her urine). She has tried NSAIDs and oral narcotics for the symptoms. The treatment provided no relief.    Past Medical History  Diagnosis Date  . Urinary tract infection   . Medical history non-contributory    Past Surgical History  Procedure Laterality Date  . Dilation and curettage of uterus     Family History  Problem Relation Age of Onset  . Hypertension Mother   . Cancer Paternal Uncle    Social History  Substance Use Topics  . Smoking status: Current Every Day Smoker    Last Attempt to Quit: 03/16/2013  . Smokeless tobacco: Never Used  . Alcohol Use: Yes     Comment: occ   OB History    Gravida Para Term Preterm AB TAB SAB Ectopic Multiple Living   3 1 1  0 2 2 0 0 0 1     Review of Systems  Constitutional: Negative for fever and chills.  Respiratory: Negative for shortness of breath.   Cardiovascular: Negative for chest pain.  Gastrointestinal: Positive for abdominal pain (from flank). Negative for nausea (none ongoing, last episode Sunday), vomiting (none ongoing, last episode Sunday), diarrhea, constipation and blood in stool.  Genitourinary: Positive for dysuria, frequency, flank pain and vaginal discharge. Negative for hematuria (now resolved), vaginal bleeding and genital sores.  Musculoskeletal: Negative for myalgias and arthralgias.  Skin: Negative for color change.  Allergic/Immunologic: Negative for immunocompromised state.  Neurological: Negative for weakness and numbness.  Psychiatric/Behavioral: Negative for confusion.   10 Systems reviewed and are negative for acute change except as noted  in the HPI.    Allergies  Review of patient's allergies indicates no known allergies.  Home Medications   Prior to Admission medications   Medication  Sig Start Date End Date Taking? Authorizing Provider  methylergonovine (METHERGINE) 0.2 MG tablet Take 1 tablet (0.2 mg total) by mouth 3 (three) times daily. 06/06/14   Rolland Porter, MD   BP 150/96 mmHg  Pulse 93  Temp(Src) 98.4 F (36.9 C) (Oral)  Resp 14  SpO2 100%  LMP 08/18/2015 Physical Exam  Constitutional: She is oriented to person, place, and time. Vital signs are normal. She appears well-developed and well-nourished.  Non-toxic appearance. No distress.  Afebrile, nontoxic, NAD. Mildly elevated BP at 150/96  HENT:  Head: Normocephalic and atraumatic.  Mouth/Throat: Oropharynx is clear and moist and mucous membranes are normal.  Eyes: Conjunctivae and EOM are normal. Right eye exhibits no discharge. Left eye exhibits no discharge.  Neck: Normal range of motion. Neck supple.  Cardiovascular: Normal rate, regular rhythm, normal heart sounds and intact distal pulses.  Exam reveals no gallop and no friction rub.   No murmur heard. Pulmonary/Chest: Effort normal and breath sounds normal. No respiratory distress. She has no decreased breath sounds. She has no wheezes. She has no rhonchi. She has no rales.  Abdominal: Soft. Normal appearance and bowel sounds are normal. She exhibits no distension. There is tenderness in the right lower quadrant, suprapubic area and left lower quadrant. There is no rigidity, no rebound, no guarding, no CVA tenderness, no tenderness at McBurney's point and negative Murphy's sign.    Soft, nondistended, +BS throughout, with mild diffuse lower abdominal TTP along pelvic brim, no r/g/r, neg murphy's, neg mcburney's, no CVA TTP   Genitourinary: Uterus normal. Pelvic exam was performed with patient supine. There is no rash, tenderness or lesion on the right labia. There is no rash, tenderness or lesion on the left labia. Cervix exhibits discharge. Cervix exhibits no motion tenderness and no friability. Right adnexum displays tenderness. Right adnexum displays no mass  and no fullness. Left adnexum displays tenderness. Left adnexum displays no mass and no fullness. No erythema, tenderness or bleeding in the vagina. Vaginal discharge found.  Chaperone present for exam. No rashes, lesions, or tenderness to external genitalia. No erythema, injury, or tenderness to vaginal mucosa. White vaginal discharge without bleeding within vaginal vault. No adnexal masses or fullness but with b/l adnexal tenderness. No CMT or cervical friability, but white mucoid discharge coming from cervical os. Uterus non-deviated, mobile, nonTTP, and without enlargement.    Musculoskeletal: Normal range of motion.  Neurological: She is alert and oriented to person, place, and time. She has normal strength. No sensory deficit.  Skin: Skin is warm, dry and intact. No rash noted.  Psychiatric: She has a normal mood and affect.  Nursing note and vitals reviewed.   ED Course  Procedures (including critical care time) Labs Review Labs Reviewed  WET PREP, GENITAL - Abnormal; Notable for the following:    Clue Cells Wet Prep HPF POC PRESENT (*)    WBC, Wet Prep HPF POC PRESENT (*)    All other components within normal limits  URINALYSIS, ROUTINE W REFLEX MICROSCOPIC (NOT AT [facility]) - Abnormal; Notable for the following:    APPearance CLOUDY (*)    Hgb urine dipstick TRACE (*)    Nitrite POSITIVE (*)    Leukocytes, UA MODERATE (*)    All other components within normal limits  CBC - Abnormal; Notable for the following:  Hemoglobin 11.5 (*)    HCT 33.7 (*)    All other components within normal limits  BASIC METABOLIC PANEL - Abnormal; Notable for the following:    Potassium 3.2 (*)    All other components within normal limits  URINE MICROSCOPIC-ADD ON - Abnormal; Notable for the following:    Squamous Epithelial / LPF 0-5 (*)    Bacteria, UA MANY (*)    All other components within normal limits  URINE CULTURE  RPR  HIV ANTIBODY (ROUTINE TESTING)  POC URINE PREG, ED  GC/CHLAMYDIA  PROBE AMP ([place]) NOT AT [facility]    Imaging Review US Transvaginal Non-ob  09/12/2015  CLINICAL DATA:  Severe adnexal pain for 2 days. Tenderness on exam. LMP 08/25/2015. EXAM: TRANSABDOMINAL AND TRANSVAGINAL ULTRASOUND OF PELVIS DOPPLER ULTRASOUND OF OVARIES TECHNIQUE: Both transabdominal and transvaginal ultrasound examinations of the pelvis were performed. Transabdominal technique was performed for global imaging of the pelvis including uterus, ovaries, adnexal regions, and pelvic cul-de-sac. It was necessary to proceed with endovaginal exam following the transabdominal exam to visualize the endometrial stripe and ovaries. Color and duplex Doppler ultrasound was utilized to evaluate blood flow to the ovaries. COMPARISON:  06/06/2014 FINDINGS: Uterus Measurements: 5.7 x 3.8 x 5.5 cm. No fibroids or other mass visualized. Endometrium Thickness: 8 mm.  No focal abnormality visualized. Right ovary Measurements: 3.7 x 2.1 x 2.1 cm. Normal appearance/no adnexal mass. Left ovary Measurements: 2.5 x 1.7 x 1.2 cm. Normal appearance/no adnexal mass. Pulsed Doppler evaluation of both ovaries demonstrates normal low-resistance arterial and venous waveforms. Other findings No abnormal free fluid. IMPRESSION: Normal appearance of uterus and both ovaries. No pelvic mass or other significant abnormality identified. No sonographic evidence for ovarian torsion. Electronically Signed   By: Earle Gell M.D.   On: 09/12/2015 15:03   US Pelvis Complete  09/12/2015  CLINICAL DATA:  Severe adnexal pain for 2 days. Tenderness on exam. LMP 08/25/2015. EXAM: TRANSABDOMINAL AND TRANSVAGINAL ULTRASOUND OF PELVIS DOPPLER ULTRASOUND OF OVARIES TECHNIQUE: Both transabdominal and transvaginal ultrasound examinations of the pelvis were performed. Transabdominal technique was performed for global imaging of the pelvis including uterus, ovaries, adnexal regions, and pelvic cul-de-sac. It was necessary to proceed with endovaginal exam  following the transabdominal exam to visualize the endometrial stripe and ovaries. Color and duplex Doppler ultrasound was utilized to evaluate blood flow to the ovaries. COMPARISON:  06/06/2014 FINDINGS: Uterus Measurements: 5.7 x 3.8 x 5.5 cm. No fibroids or other mass visualized. Endometrium Thickness: 8 mm.  No focal abnormality visualized. Right ovary Measurements: 3.7 x 2.1 x 2.1 cm. Normal appearance/no adnexal mass. Left ovary Measurements: 2.5 x 1.7 x 1.2 cm. Normal appearance/no adnexal mass. Pulsed Doppler evaluation of both ovaries demonstrates normal low-resistance arterial and venous waveforms. Other findings No abnormal free fluid. IMPRESSION: Normal appearance of uterus and both ovaries. No pelvic mass or other significant abnormality identified. No sonographic evidence for ovarian torsion. Electronically Signed   By: Earle Gell M.D.   On: 09/12/2015 15:03   [place] Art/ven Flow Abd Pelv Doppler  09/12/2015  CLINICAL DATA:  Severe adnexal pain for 2 days. Tenderness on exam. LMP 08/25/2015. EXAM: TRANSABDOMINAL AND TRANSVAGINAL ULTRASOUND OF PELVIS DOPPLER ULTRASOUND OF OVARIES TECHNIQUE: Both transabdominal and transvaginal ultrasound examinations of the pelvis were performed. Transabdominal technique was performed for global imaging of the pelvis including uterus, ovaries, adnexal regions, and pelvic cul-de-sac. It was necessary to proceed with endovaginal exam following the transabdominal exam to visualize the endometrial stripe and ovaries.  Color and duplex Doppler ultrasound was utilized to evaluate blood flow to the ovaries. COMPARISON:  06/06/2014 FINDINGS: Uterus Measurements: 5.7 x 3.8 x 5.5 cm. No fibroids or other mass visualized. Endometrium Thickness: 8 mm.  No focal abnormality visualized. Right ovary Measurements: 3.7 x 2.1 x 2.1 cm. Normal appearance/no adnexal mass. Left ovary Measurements: 2.5 x 1.7 x 1.2 cm. Normal appearance/no adnexal mass. Pulsed Doppler evaluation of both  ovaries demonstrates normal low-resistance arterial and venous waveforms. Other findings No abnormal free fluid. IMPRESSION: Normal appearance of uterus and both ovaries. No pelvic mass or other significant abnormality identified. No sonographic evidence for ovarian torsion. Electronically Signed   By: Earle Gell M.D.   On: 09/12/2015 15:03   I have personally reviewed and evaluated these images and lab results as part of my medical decision-making.   EKG Interpretation None      MDM   Final diagnoses:  Right flank pain  Dysuria  Urinary frequency  Vaginal discharge  UTI (lower urinary tract infection)  Bacterial vaginosis  Hypokalemia  Anemia, unspecified anemia type    29 y.o. female here with right flank pain 2 weeks which became more constant 3 days ago, initially had nausea and vomiting which has ceased, also had hematuria which is resolved. Continues to have dysuria, frequency, and developed white vaginal discharge 1 week ago. On exam, mild lower abdominal tenderness noted pelvic brim, no McBurney's point tenderness, no CVA tenderness. Will obtain pelvic exam to fully evaluate pelvic tenderness. Labs performed previous to exam reveal a upreg which is negative, CBC unremarkable aside from chronic baseline mild anemia, and BMP with mildly low K but doubt need for repletion. U/A pending. Will get STD check and pelvic, and then decide on imaging. Doubt nephrolithiasis, pyelonephritis, or appendicitis given how long symptoms have persisted. Could be UTI or pelvic etiology. Will reassess after pelvic. Will give toradol IM for pain since pt is driving.   [id number] PM U/A resulting with +nitrites, +leuks, few squamous, 6-30 WBC, and many bacteria. Will treat for UTI, will culture it since pt gets recurrent UTIs therefore want to assess for possible resistance and define exact bacteria if possible. On pelvic exam, white vaginal discharge in vault and at cervical os, no CMT but some adnexal  tenderness, will empirically treat for GC/CT, and obtain pelvic u/s. Will reassess shortly.   3:12 PM Pain improved. Wet prep showing clue cells, will treat for BV. U/S negative for acute findings. Pain/symptoms likely related to UTI and BV. Will give bactrim since pt usually gets macrobid, want to see if a different abx may be more effective. UCx sent, if organism is resistant to bactrim then lab will call. Discussed good hydration, tylenol/motrin for pain, and f/up with PCP in 1wk. Discussed no intercourse until she gets results of STD testing, and importance of condom use. I explained the diagnosis and have given explicit precautions to return to the ER including for any other new or worsening symptoms. The patient understands and accepts the medical plan as it's been dictated and I have answered their questions. Discharge instructions concerning home care and prescriptions have been given. The patient is STABLE and is discharged to home in good condition.  BP 136/104 mmHg  Pulse 60  Temp(Src) 98.4 F (36.9 C) (Oral)  Resp 22  SpO2 99%  LMP 08/18/2015  Meds ordered this encounter  Medications  . ketorolac (TORADOL) 30 MG/ML injection 30 mg    Sig:   . azithromycin (ZITHROMAX) tablet 1,000  mg    Sig:    And  . cefTRIAXone (ROCEPHIN) injection 250 mg    Sig:     Order Specific Question:  Antibiotic Indication:    Answer:  STD  . lidocaine (PF) (XYLOCAINE) 1 % injection    Sig:     Preyer, Elizabeth   : cabinet override  . sulfamethoxazole-trimethoprim (BACTRIM DS,SEPTRA DS) 800-160 MG tablet    Sig: Take 1 tablet by mouth 2 (two) times daily.    Dispense:  14 tablet    Refill:  0    Order Specific Question:  Supervising Provider    Answer:  MILLER, BRIAN [[id number]]  . metroNIDAZOLE (FLAGYL) 500 MG tablet    Sig: Take 1 tablet (500 mg total) by mouth 2 (two) times daily. One po bid x 7 days    Dispense:  14 tablet    Refill:  0    Order Specific Question:  Supervising Provider     Answer:  Noemi Chapel [[id number]]     Quasim Doyon Camprubi-Soms, PA-C 09/12/15 1514  Tanna Furry, MD 09/15/15 [phone]

## 2015-09-12 NOTE — ED Notes (Signed)
Pt remains in US

## 2015-09-12 NOTE — ED Notes (Signed)
PA at bedside.

## 2015-09-12 NOTE — ED Notes (Signed)
Pt reports right flank pain for several days. Has hx of same with chronic uti and kidney inf in past. Reports n/v. Reports hematuria x 2.

## 2015-09-12 NOTE — ED Notes (Signed)
Patient transported to Ultrasound 

## 2015-09-12 NOTE — Discharge Instructions (Signed)
Follow up with [facility]D clinic for future STD concerns or screenings. This is the recommendation by the CDC for people with multiple sexual partners or hx of STDs. You have been treated for gonorrhea and chlamydia in the ER but the hospital will call you if lab is positive. You were tested for HIV and Syphilis, and the hospital will call you if the lab is positive. Do not engage in sexual activity until you find out about your test results, as this will invalidate your treatment today.  Your labs today showed that your potassium was slightly low, follow the list of foods below to help supplement potassium in your diet.   Your labs also revealed you have bacterial vaginosis, take flagyl as directed and avoid alcohol intake while taking this medication. Always use condoms when engaging in intercourse.   Your urine shows a urinary tract infection. Stay very well hydrated with plenty of water throughout the day. Take antibiotic until completed. Your urine will be cultured, if the antibiotic needs to be changed then the lab will call you. Use tylenol or motrin as needed for pain. Follow up with a primary care doctor from your insurance carrier in 1 week for recheck of ongoing symptoms but return to ER for emergent changing or worsening of symptoms. Please seek immediate care if you develop the following: You develop back pain.  Your symptoms are no better, or worse in 3 days. There is severe back pain or lower abdominal pain.  You develop chills.  You have a fever.  There is nausea or vomiting.  There is continued burning or discomfort with urination.     Urinary Tract Infection A urinary tract infection (UTI) can occur any place along the urinary tract. The tract includes the kidneys, ureters, bladder, and urethra. A type of germ called bacteria often causes a UTI. UTIs are often helped with antibiotic medicine.  HOME CARE   If given, take antibiotics as told by your  doctor. Finish them even if you start to feel better.  Drink enough fluids to keep your pee (urine) clear or pale yellow.  Avoid tea, drinks with caffeine, and bubbly (carbonated) drinks.  Pee often. Avoid holding your pee in for a long time.  Pee before and after having sex (intercourse).  Wipe from front to back after you poop (bowel movement) if you are a woman. Use each tissue only once. GET HELP RIGHT AWAY IF:   You have back pain.  You have lower belly (abdominal) pain.  You have chills.  You feel sick to your stomach (nauseous).  You throw up (vomit).  Your burning or discomfort with peeing does not go away.  You have a fever.  Your symptoms are not better in 3 days. MAKE SURE YOU:   Understand these instructions.  Will watch your condition.  Will get help right away if you are not doing well or get worse.   This information is not intended to replace advice given to you by your health care provider. Make sure you discuss any questions you have with your health care provider.   Document Released: 03/02/2008 Document Revised: 10/05/2014 Document Reviewed: 04/14/2012 Elsevier Interactive Patient Education 2016 Elsevier Inc.  Bacterial Vaginosis Bacterial vaginosis is a vaginal infection that occurs when the normal balance of bacteria in the vagina is disrupted. It results from an overgrowth of certain bacteria. This is the most common vaginal infection in women of childbearing age. Treatment is important to prevent  complications, especially in pregnant women, as it can cause a premature delivery. CAUSES  Bacterial vaginosis is caused by an increase in harmful bacteria that are normally present in smaller amounts in the vagina. Several different kinds of bacteria can cause bacterial vaginosis. However, the reason that the condition develops is not fully understood. RISK FACTORS Certain activities or behaviors can put you at an increased risk of developing bacterial  vaginosis, including:  Having a new sex partner or multiple sex partners.  Douching.  Using an intrauterine device (IUD) for contraception. Women do not get bacterial vaginosis from toilet seats, bedding, swimming pools, or contact with objects around them. SIGNS AND SYMPTOMS  Some women with bacterial vaginosis have no signs or symptoms. Common symptoms include:  Grey vaginal discharge.  A fishlike odor with discharge, especially after sexual intercourse.  Itching or burning of the vagina and vulva.  Burning or pain with urination. DIAGNOSIS  Your health care provider will take a medical history and examine the vagina for signs of bacterial vaginosis. A sample of vaginal fluid may be taken. Your health care provider will look at this sample under a microscope to check for bacteria and abnormal cells. A vaginal pH test may also be done.  TREATMENT  Bacterial vaginosis may be treated with antibiotic medicines. These may be given in the form of a pill or a vaginal cream. A second round of antibiotics may be prescribed if the condition comes back after treatment. Because bacterial vaginosis increases your risk for sexually transmitted diseases, getting treated can help reduce your risk for chlamydia, gonorrhea, HIV, and herpes. HOME CARE INSTRUCTIONS   Only take over-the-counter or prescription medicines as directed by your health care provider.  If antibiotic medicine was prescribed, take it as directed. Make sure you finish it even if you start to feel better.  Tell all sexual partners that you have a vaginal infection. They should see their health care provider and be treated if they have problems, such as a mild rash or itching.  During treatment, it is important that you follow these instructions:  Avoid sexual activity or use condoms correctly.  Do not douche.  Avoid alcohol as directed by your health care provider.  Avoid breastfeeding as directed by your health care  provider. SEEK MEDICAL CARE IF:   Your symptoms are not improving after 3 days of treatment.  You have increased discharge or pain.  You have a fever. MAKE SURE YOU:   Understand these instructions.  Will watch your condition.  Will get help right away if you are not doing well or get worse. FOR MORE INFORMATION  Centers for Disease Control and Prevention, Division of STD Prevention: [employer] American Sexual Health Association (ASHA): www.ashastd.org    This information is not intended to replace advice given to you by your health care provider. Make sure you discuss any questions you have with your health care provider.   Document Released: 09/14/2005 Document Revised: 10/05/2014 Document Reviewed: 04/26/2013 Elsevier Interactive Patient Education 2016 Elsevier Inc.  Flank Pain Flank pain refers to pain that is located on the side of the body between the upper abdomen and the back. The pain may occur over a short period of time (acute) or may be long-term or reoccurring (chronic). It may be mild or severe. Flank pain can be caused by many things. CAUSES  Some of the more common causes of flank pain include:  Muscle strains.   Muscle spasms.   A disease of your spine (  vertebral disk disease).   A lung infection (pneumonia).   Fluid around your lungs (pulmonary edema).   A kidney infection.   Kidney stones.   A very painful skin rash caused by the chickenpox virus (shingles).   Gallbladder disease.  [place] care will depend on the cause of your pain. In general,  Rest as directed by your caregiver.  Drink enough fluids to keep your urine clear or pale yellow.  Only take over-the-counter or prescription medicines as directed by your caregiver. Some medicines may help relieve the pain.  Tell your caregiver about any changes in your pain.  Follow up with your caregiver as directed. SEEK IMMEDIATE MEDICAL CARE IF:   Your  pain is not controlled with medicine.   You have new or worsening symptoms.  Your pain increases.   You have abdominal pain.   You have shortness of breath.   You have persistent nausea or vomiting.   You have swelling in your abdomen.   You feel faint or pass out.   You have blood in your urine.  You have a fever or persistent symptoms for more than 2-3 days.  You have a fever and your symptoms suddenly get worse. MAKE SURE YOU:   Understand these instructions.  Will watch your condition.  Will get help right away if you are not doing well or get worse.   This information is not intended to replace advice given to you by your health care provider. Make sure you discuss any questions you have with your health care provider.   Document Released: 11/05/2005 Document Revised: 06/08/2012 Document Reviewed: 04/28/2012 Elsevier Interactive Patient Education 2016 [employer].  Hypokalemia Hypokalemia means that the amount of potassium in the blood is lower than normal.Potassium is a chemical, called an electrolyte, that helps regulate the amount of fluid in the body. It also stimulates muscle contraction and helps nerves function properly.Most of the body's potassium is inside of cells, and only a very small amount is in the blood. Because the amount in the blood is so small, minor changes can be life-threatening. CAUSES  Antibiotics.  Diarrhea or vomiting.  Using laxatives too much, which can cause diarrhea.  Chronic kidney disease.  Water pills (diuretics).  Eating disorders (bulimia).  Low magnesium level.  Sweating a lot. SIGNS AND SYMPTOMS  Weakness.  Constipation.  Fatigue.  Muscle cramps.  Mental confusion.  Skipped heartbeats or irregular heartbeat (palpitations).  Tingling or numbness. DIAGNOSIS  Your health care provider can diagnose hypokalemia with blood tests. In addition to checking your potassium level, your health care provider  may also check other lab tests. TREATMENT Hypokalemia can be treated with potassium supplements taken by mouth or adjustments in your current medicines. If your potassium level is very low, you may need to get potassium through a vein (IV) and be monitored in the hospital. A diet high in potassium is also helpful. Foods high in potassium are:  Nuts, such as peanuts and pistachios.  Seeds, such as sunflower seeds and pumpkin seeds.  Peas, lentils, and lima beans.  Whole grain and bran cereals and breads.  Fresh fruit and vegetables, such as apricots, avocado, bananas, cantaloupe, kiwi, oranges, tomatoes, asparagus, and potatoes.  Orange and tomato juices.  Red meats.  Fruit yogurt. HOME CARE INSTRUCTIONS  Take all medicines as prescribed by your health care provider.  Maintain a healthy diet by including nutritious food, such as fruits, vegetables, nuts, whole grains, and lean meats.  If you are taking a laxative, be sure to follow the directions on the label. SEEK MEDICAL CARE IF:  Your weakness gets worse.  You feel your heart pounding or racing.  You are vomiting or having diarrhea.  You are diabetic and having trouble keeping your blood glucose in the normal range. SEEK IMMEDIATE MEDICAL CARE IF:  You have chest pain, shortness of breath, or dizziness.  You are vomiting or having diarrhea for more than 2 days.  You faint. MAKE SURE YOU:   Understand these instructions.  Will watch your condition.  Will get help right away if you are not doing well or get worse.   This information is not intended to replace advice given to you by your health care provider. Make sure you discuss any questions you have with your health care provider.   Document Released: 09/14/2005 Document Revised: 10/05/2014 Document Reviewed: 03/17/2013 Elsevier Interactive Patient Education 2016 [place].  Potassium Content of Foods Potassium is a mineral found in many foods and  drinks. It helps keep fluids and minerals balanced in your body and affects how steadily your heart beats. Potassium also helps control your blood pressure and keep your muscles and nervous system healthy. Certain health conditions and medicines may change the balance of potassium in your body. When this happens, you can help balance your level of potassium through the foods that you do or do not eat. Your health care provider or dietitian may recommend an amount of potassium that you should have each day. The following lists of foods provide the amount of potassium (in parentheses) per serving in each item. HIGH IN POTASSIUM  The following foods and beverages have 200 mg or more of potassium per serving:  Apricots, 2 raw or 5 dry (200 mg).  Artichoke, 1 medium (345 mg).  Avocado, raw,  each (245 mg).  Banana, 1 medium (425 mg).  Beans, lima, or baked beans, canned,  cup (280 mg).  Beans, white, canned,  cup (595 mg).  Beef roast, 3 oz (320 mg).  Beef, ground, 3 oz (270 mg).  Beets, raw or cooked,  cup (260 mg).  Bran muffin, 2 oz (300 mg).  Broccoli,  cup (230 mg).  Brussels sprouts,  cup (250 mg).  Cantaloupe,  cup (215 mg).  Cereal, 100% bran,  cup (200-400 mg).  Cheeseburger, single, fast food, 1 each (225-400 mg).  Chicken, 3 oz (220 mg).  Clams, canned, 3 oz (535 mg).  Crab, 3 oz (225 mg).  Dates, 5 each (270 mg).  Dried beans and peas,  cup (300-475 mg).  Figs, dried, 2 each (260 mg).  Fish: halibut, tuna, cod, snapper, 3 oz (480 mg).  Fish: salmon, haddock, swordfish, perch, 3 oz (300 mg).  Fish, tuna, canned 3 oz (200 mg).  [place] fries, fast food, 3 oz (470 mg).  Granola with fruit and nuts,  cup (200 mg).  Grapefruit juice,  cup (200 mg).  Greens, beet,  cup (655 mg).  Honeydew melon,  cup (200 mg).  Kale, raw, 1 cup (300 mg).  Kiwi, 1 medium (240 mg).  Kohlrabi, rutabaga, parsnips,  cup (280 mg).  Lentils,  cup (365  mg).  Mango, 1 each (325 mg).  Milk, chocolate, 1 cup (420 mg).  Milk: nonfat, low-fat, whole, buttermilk, 1 cup (350-380 mg).  Molasses, 1 Tbsp (295 mg).  Mushrooms,  cup (280) mg.  Nectarine, 1 each (275 mg).  Nuts: almonds, peanuts, hazelnuts, [place], cashew, mixed, 1 oz (200 mg).  Nuts, pistachios, 1 oz (295 mg).  Orange, 1 each (240 mg).  Orange juice,  cup (235 mg).  Papaya, medium,  fruit (390 mg).  Peanut butter, chunky, 2 Tbsp (240 mg).  Peanut butter, smooth, 2 Tbsp (210 mg).  Pear, 1 medium (200 mg).  Pomegranate, 1 whole (400 mg).  Pomegranate juice,  cup (215 mg).  Pork, 3 oz (350 mg).  Potato chips, salted, 1 oz (465 mg).  Potato, baked with skin, 1 medium (925 mg).  Potatoes, boiled,  cup (255 mg).  Potatoes, mashed,  cup (330 mg).  Prune juice,  cup (370 mg).  Prunes, 5 each (305 mg).  Pudding, chocolate,  cup (230 mg).  Pumpkin, canned,  cup (250 mg).  Raisins, seedless,  cup (270 mg).  Seeds, sunflower or pumpkin, 1 oz (240 mg).  Soy milk, 1 cup (300 mg).  Spinach,  cup (420 mg).  Spinach, canned,  cup (370 mg).  Sweet potato, baked with skin, 1 medium (450 mg).  Swiss chard,  cup (480 mg).  Tomato or vegetable juice,  cup (275 mg).  Tomato sauce or puree,  cup (400-550 mg).  Tomato, raw, 1 medium (290 mg).  Tomatoes, canned,  cup (200-300 mg).  [place], 3 oz (250 mg).  Wheat germ, 1 oz (250 mg).  Winter squash,  cup (250 mg).  Yogurt, plain or fruited, 6 oz (260-435 mg).  Zucchini,  cup (220 mg). MODERATE IN POTASSIUM The following foods and beverages have 50-200 mg of potassium per serving:  Apple, 1 each (150 mg).  Apple juice,  cup (150 mg).  Applesauce,  cup (90 mg).  Apricot nectar,  cup (140 mg).  Asparagus, small spears,  cup or 6 spears (155 mg).  Bagel, cinnamon raisin, 1 each (130 mg).  Bagel, egg or plain, 4 in., 1 each (70 mg).  Beans, green,  cup (90 mg).  Beans,  yellow,  cup (190 mg).  Beer, regular, 12 oz (100 mg).  Beets, canned,  cup (125 mg).  Blackberries,  cup (115 mg).  Blueberries,  cup (60 mg).  Bread, whole wheat, 1 slice (70 mg).  Broccoli, raw,  cup (145 mg).  Cabbage,  cup (150 mg).  Carrots, cooked or raw,  cup (180 mg).  Cauliflower, raw,  cup (150 mg).  Celery, raw,  cup (155 mg).  Cereal, bran flakes, cup (120-150 mg).  Cheese, cottage,  cup (110 mg).  Cherries, 10 each (150 mg).  Chocolate, 1 oz bar (165 mg).  Coffee, brewed 6 oz (90 mg).  Corn,  cup or 1 ear (195 mg).  Cucumbers,  cup (80 mg).  Egg, large, 1 each (60 mg).  Eggplant,  cup (60 mg).  Endive, raw, cup (80 mg).  English muffin, 1 each (65 mg).  Fish, orange roughy, 3 oz (150 mg).  Frankfurter, beef or pork, 1 each (75 mg).  Fruit cocktail,  cup (115 mg).  Grape juice,  cup (170 mg).  Grapefruit,  fruit (175 mg).  Grapes,  cup (155 mg).  Greens: kale, turnip, collard,  cup (110-150 mg).  Ice cream or frozen yogurt, chocolate,  cup (175 mg).  Ice cream or frozen yogurt, vanilla,  cup (120-150 mg).  Lemons, limes, 1 each (80 mg).  Lettuce, all types, 1 cup (100 mg).  Mixed vegetables,  cup (150 mg).  Mushrooms, raw,  cup (110 mg).  Nuts: walnuts, pecans, or macadamia, 1 oz (125 mg).  Oatmeal,  cup (80 mg).  Okra,  cup (110  mg).  Onions, raw,  cup (120 mg).  Peach, 1 each (185 mg).  Peaches, canned,  cup (120 mg).  Pears, canned,  cup (120 mg).  Peas, green, frozen,  cup (90 mg).  Peppers, green,  cup (130 mg).  Peppers, red,  cup (160 mg).  Pineapple juice,  cup (165 mg).  Pineapple, fresh or canned,  cup (100 mg).  Plums, 1 each (105 mg).  Pudding, vanilla,  cup (150 mg).  Raspberries,  cup (90 mg).  Rhubarb,  cup (115 mg).  Rice, wild,  cup (80 mg).  Shrimp, 3 oz (155 mg).  Spinach, raw, 1 cup (170 mg).  Strawberries,  cup (125 mg).  Summer squash   cup (175-200 mg).  Swiss chard, raw, 1 cup (135 mg).  Tangerines, 1 each (140 mg).  Tea, brewed, 6 oz (65 mg).  Turnips,  cup (140 mg).  Watermelon,  cup (85 mg).  Wine, red, table, 5 oz (180 mg).  Wine, white, table, 5 oz (100 mg). LOW IN POTASSIUM The following foods and beverages have less than 50 mg of potassium per serving.  Bread, white, 1 slice (30 mg).  Carbonated beverages, 12 oz (less than 5 mg).  Cheese, 1 oz (20-30 mg).  Cranberries,  cup (45 mg).  Cranberry juice cocktail,  cup (20 mg).  Fats and oils, 1 Tbsp (less than 5 mg).  Hummus, 1 Tbsp (32 mg).  Nectar: papaya, mango, or pear,  cup (35 mg).  Rice, white or brown,  cup (50 mg).  Spaghetti or macaroni,  cup cooked (30 mg).  Tortilla, flour or corn, 1 each (50 mg).  Waffle, 4 in., 1 each (50 mg).  Water chestnuts,  cup (40 mg).   This information is not intended to replace advice given to you by your health care provider. Make sure you discuss any questions you have with your health care provider.   Document Released: 04/28/2005 Document Revised: 09/19/2013 Document Reviewed: 08/11/2013 Elsevier Interactive Patient Education [employer].  Sexually Transmitted Disease A sexually transmitted disease (STD) is a disease or infection that may be passed (transmitted) from person to person, usually during sexual activity. This may happen by way of saliva, semen, blood, vaginal mucus, or urine. Common STDs include:  Gonorrhea.  Chlamydia.  Syphilis.  HIV and AIDS.  Genital herpes.  Hepatitis B and C.  Trichomonas.  Human papillomavirus (HPV).  Pubic lice.  Scabies.  Mites.  Bacterial vaginosis. WHAT ARE CAUSES OF STDs? An STD may be caused by bacteria, a virus, or parasites. STDs are often transmitted during sexual activity if one person is infected. However, they may also be transmitted through nonsexual means. STDs may be transmitted after:   Sexual intercourse  with an infected person.  Sharing sex toys with an infected person.  Sharing needles with an infected person or using unclean piercing or tattoo needles.  Having intimate contact with the genitals, mouth, or rectal areas of an infected person.  Exposure to infected fluids during birth. WHAT ARE THE SIGNS AND SYMPTOMS OF STDs? Different STDs have different symptoms. Some people may not have any symptoms. If symptoms are present, they may include:  Painful or bloody urination.  Pain in the pelvis, abdomen, vagina, anus, throat, or eyes.  A skin rash, itching, or irritation.  Growths, ulcerations, blisters, or sores in the genital and anal areas.  Abnormal vaginal discharge with or without bad odor.  Penile discharge in men.  Fever.  Pain or bleeding during sexual  intercourse.  Swollen glands in the groin area.  Yellow skin and eyes (jaundice). This is seen with hepatitis.  Swollen testicles.  Infertility.  Sores and blisters in the mouth. HOW ARE STDs DIAGNOSED? To make a diagnosis, your health care provider may:  Take a medical history.  Perform a physical exam.  Take a sample of any discharge to examine.  Swab the throat, cervix, opening to the penis, rectum, or vagina for testing.  Test a sample of your first morning urine.  Perform blood tests.  Perform a Pap test, if this applies.  Perform a colposcopy.  Perform a laparoscopy. HOW ARE STDs TREATED? Treatment depends on the STD. Some STDs may be treated but not cured.  Chlamydia, gonorrhea, trichomonas, and syphilis can be cured with antibiotic medicine.  Genital herpes, hepatitis, and HIV can be treated, but not cured, with prescribed medicines. The medicines lessen symptoms.  Genital warts from HPV can be treated with medicine or by freezing, burning (electrocautery), or surgery. Warts may come back.  HPV cannot be cured with medicine or surgery. However, abnormal areas may be removed from the  cervix, vagina, or vulva.  If your diagnosis is confirmed, your recent sexual partners need treatment. This is true even if they are symptom-free or have a negative culture or evaluation. They should not have sex until their health care providers say it is okay.  Your health care provider may test you for infection again 3 months after treatment. HOW CAN I REDUCE MY RISK OF GETTING AN STD? Take these steps to reduce your risk of getting an STD:  Use latex condoms, dental dams, and water-soluble lubricants during sexual activity. Do not use petroleum jelly or oils.  Avoid having multiple sex partners.  Do not have sex with someone who has other sex partners  Do not have sex with anyone you do not know or who is at high risk for an STD.  Avoid risky sex practices that can break your skin.  Do not have sex if you have open sores on your mouth or skin.  Avoid drinking too much alcohol or taking illegal drugs. Alcohol and drugs can affect your judgment and put you in a vulnerable position.  Avoid engaging in oral and anal sex acts.  Get vaccinated for HPV and hepatitis. If you have not received these vaccines in the past, talk to your health care provider about whether one or both might be right for you.  If you are at risk of being infected with HIV, it is recommended that you take a prescription medicine daily to prevent HIV infection. This is called pre-exposure prophylaxis (PrEP). You are considered at risk if:  You are a man who has sex with other men (MSM).  You are a heterosexual man or woman and are sexually active with more than one partner.  You take drugs by injection.  You are sexually active with a partner who has HIV.  Talk with your health care provider about whether you are at high risk of being infected with HIV. If you choose to begin PrEP, you should first be tested for HIV. You should then be tested every 3 months for as long as you are taking PrEP. WHAT SHOULD I  DO IF I THINK I HAVE AN STD?  See your health care provider.  Tell your sexual partner(s). They should be tested and treated for any STDs.  Do not have sex until your health care provider says it is okay. WHEN  SHOULD I GET IMMEDIATE MEDICAL CARE? Contact your health care provider right away if:   You have severe abdominal pain.  You are a man and notice swelling or pain in your testicles.  You are a woman and notice swelling or pain in your vagina.   This information is not intended to replace advice given to you by your health care provider. Make sure you discuss any questions you have with your health care provider.   Document Released: 12/05/2002 Document Revised: 10/05/2014 Document Reviewed: 04/04/2013 Elsevier Interactive Patient Education [employer].

## 2015-09-12 NOTE — ED Notes (Signed)
Pt is in stable condition upon d/c and ambulates from ED. 

## 2015-09-13 LAB — GC/CHLAMYDIA PROBE AMP (~~LOC~~) NOT AT ARMC
Chlamydia: NEGATIVE
NEISSERIA GONORRHEA: POSITIVE — AB

## 2015-09-13 LAB — HIV ANTIBODY (ROUTINE TESTING W REFLEX): HIV Screen 4th Generation wRfx: NONREACTIVE

## 2015-09-13 LAB — RPR: RPR Ser Ql: NONREACTIVE

## 2015-09-15 LAB — URINE CULTURE: Culture: >=100000

## 2015-09-16 ENCOUNTER — Telehealth (HOSPITAL_BASED_OUTPATIENT_CLINIC_OR_DEPARTMENT_OTHER): Payer: Self-pay | Admitting: Emergency Medicine

## 2015-09-16 NOTE — Telephone Encounter (Signed)
Post ED Visit - Positive Culture Follow-up  Culture report reviewed by antimicrobial stewardship pharmacist:  [x]  Elenor Quinones, Pharm.D. []  Heide Guile, Pharm.D., BCPS []  Parks Neptune, Pharm.D. []  Alycia Rossetti, Pharm.D., BCPS []  [place], Pharm.D., BCPS, AAHIVP []  Legrand Como, Pharm.D., BCPS, AAHIVP []  Milus Glazier, Pharm.D. []  Stephens November, Pharm.D.  Positive urine culture E. Coli Treated with bactrim DS and flagyl, organism sensitive to the same and no further patient follow-up is required at this time.  Hazle Nordmann 09/16/2015, 10:20 AM

## 2015-09-18 ENCOUNTER — Telehealth (HOSPITAL_COMMUNITY): Payer: Self-pay

## 2015-09-18 NOTE — Telephone Encounter (Signed)
Unable to reach x 3.  Letter sent to [facility] address.

## 2015-10-01 ENCOUNTER — Telehealth (HOSPITAL_COMMUNITY): Payer: Self-pay

## 2015-10-01 NOTE — Telephone Encounter (Signed)
Unable to reach by phone or mail.  Chart closed.   

## 2015-10-02 ENCOUNTER — Ambulatory Visit (INDEPENDENT_AMBULATORY_CARE_PROVIDER_SITE_OTHER): Payer: PRIVATE HEALTH INSURANCE | Admitting: Family Medicine

## 2015-10-02 VITALS — BP 122/70 | HR 76 | Temp 98.5°F | Resp 16 | Ht 62.0 in | Wt 94.0 lb

## 2015-10-02 DIAGNOSIS — M62830 Muscle spasm of back: Secondary | ICD-10-CM | POA: Insufficient documentation

## 2015-10-02 MED ORDER — NAPROXEN 500 MG PO TABS: 500.0000 mg | ORAL_TABLET | Freq: Two times a day (BID) | ORAL | Status: DC | PRN

## 2015-10-02 MED ORDER — CYCLOBENZAPRINE HCL 10 MG PO TABS: 10.0000 mg | ORAL_TABLET | Freq: Three times a day (TID) | ORAL | Status: DC | PRN

## 2015-10-02 MED ORDER — TRAMADOL HCL 50 MG PO TABS: 50.0000 mg | ORAL_TABLET | Freq: Four times a day (QID) | ORAL | Status: DC | PRN

## 2015-10-02 NOTE — Assessment & Plan Note (Signed)
Pt with spinalis lumbar paraspinal muscle spasms 2/2 increased activity - Heat to area as needed as we are past the acute period - Flexeril PRN, Naprosyn 500 mg BID x 7-10 days, and Tramadol PRN (No hx of seizures) - No red flags, f/u as needed.  OOW until 10/07/15

## 2015-10-02 NOTE — Progress Notes (Signed)
Patient ID: Mariah Wallace, female   DOB: October 29, 1985, 30 y.o.   MRN: [id number]   Mariah Wallace - 30 y.o. female MRN [id number]  Date of birth: 1986-06-10  Mariah Wallace is a 30 y.o. who presents today for low back pain.   Low back pain - Ongoing now for about 5 days, low back, thinks its muscle spasm.  Has been taking ibuprofen 800 mg PRN which has helped a lot.   May have picked up an object in a non natural position.  Denies paresthesias down your leg.  Pt denies any current bowel/bladder problems, fever, chills, unintentional weight loss, night time awakenings secondary to pain, weakness in one or both legs.  Hurts more when she bends forward.     Past Medical History  Diagnosis Date  . Urinary tract infection   . Medical history non-contributory   ,  Family History  Problem Relation Age of Onset  . Hypertension Mother   . Cancer Paternal Uncle   ,  Social History   Social History  . Marital Status: Single    Spouse Name: N/A  . Number of Children: N/A  . Years of Education: N/A   Occupational History  . Not on file.   Social History Main Topics  . Smoking status: Current Every Day Smoker    Last Attempt to Quit: 03/16/2013  . Smokeless tobacco: Never Used  . Alcohol Use: Yes     Comment: occ  . Drug Use: No  . Sexual Activity: Yes    Birth Control/ Protection: None   Other Topics Concern  . Not on file   Social History Narrative  ,  Past Surgical History  Procedure Laterality Date  . Dilation and curettage of uterus      12 point ROS negative other than per HPI.   Physical Exam Filed Vitals:   10/02/15 1724  BP: 122/70  Pulse: 76  Temp: 98.5 F (36.9 C)  Resp: 16    Gen: NAD, AAO 3 Cardiorespiratory - Normal respiratory effort/rate.  RRR Skin: No rashes or erythema Extremities: No edema, pulses +2 bilateral upper and lower extremity Neuro: CN 2-12 intact, MS 5/5 B/L UE and LE, +2 patellar and achilles relfex b/l  Back Exam: 1.Gait   1. Walk on heels (L5 root)  Normal   Walk on toes (S1 root)  Normal  2. TTP along Lumbar Vertebrae - Yes, paraspinal  3. Pain with :   1) Extension -Negative   2) Flexion - Negative  4. One Legged Hyperextension for Spondy - Negative  5. Straight Leg Raise - Positive @  1) Radiation into opposite leg - Negative   2) Worse with Dorsiflexion of ankle - Negative  6. Sitting Leg Raise - Positive @  7. DTR - +2/4 Patellar/Achilles 8. MS - 5/5 L2-S1 myotome strength B/L  9. Vascular Exam : DP and PT +2 B/L

## 2015-10-03 ENCOUNTER — Telehealth: Payer: Self-pay

## 2015-10-03 NOTE — Telephone Encounter (Signed)
Done

## 2015-10-03 NOTE — Telephone Encounter (Signed)
Pt lost her work note and would like for it to be faxed to her employer. Fax number is  [phone]  Please advise her when its done  [phone]

## 2015-12-05 ENCOUNTER — Emergency Department (HOSPITAL_COMMUNITY): Payer: Self-pay

## 2015-12-05 ENCOUNTER — Emergency Department (HOSPITAL_COMMUNITY)
Admission: EM | Admit: 2015-12-05 | Discharge: 2015-12-06 | Disposition: A | Discharge status: Home / Self Care | Payer: Self-pay | Attending: Emergency Medicine | Admitting: Emergency Medicine

## 2015-12-05 ENCOUNTER — Encounter (HOSPITAL_COMMUNITY): Payer: Self-pay | Admitting: Emergency Medicine

## 2015-12-05 DIAGNOSIS — Z87440 Personal history of urinary (tract) infections: Secondary | ICD-10-CM | POA: Insufficient documentation

## 2015-12-05 DIAGNOSIS — Y998 Other external cause status: Secondary | ICD-10-CM | POA: Insufficient documentation

## 2015-12-05 DIAGNOSIS — S93401A Sprain of unspecified ligament of right ankle, initial encounter: Secondary | ICD-10-CM | POA: Insufficient documentation

## 2015-12-05 DIAGNOSIS — M62830 Muscle spasm of back: Secondary | ICD-10-CM

## 2015-12-05 DIAGNOSIS — F17200 Nicotine dependence, unspecified, uncomplicated: Secondary | ICD-10-CM | POA: Insufficient documentation

## 2015-12-05 DIAGNOSIS — Y9389 Activity, other specified: Secondary | ICD-10-CM | POA: Insufficient documentation

## 2015-12-05 DIAGNOSIS — X58XXXA Exposure to other specified factors, initial encounter: Secondary | ICD-10-CM | POA: Insufficient documentation

## 2015-12-05 DIAGNOSIS — Y9289 Other specified places as the place of occurrence of the external cause: Secondary | ICD-10-CM | POA: Insufficient documentation

## 2015-12-05 MED ORDER — NAPROXEN 500 MG PO TABS: 500.0000 mg | ORAL_TABLET | Freq: Two times a day (BID) | ORAL | Status: DC | PRN

## 2015-12-05 MED ORDER — IBUPROFEN 400 MG PO TABS
800.0000 mg | ORAL_TABLET | Freq: Once | ORAL | Status: AC
  Administered 2015-12-05: 800 mg via ORAL
  Filled 2015-12-05: qty 2

## 2015-12-05 NOTE — ED Notes (Signed)
Pt reports she injured her R ankle earlier today.

## 2015-12-05 NOTE — Discharge Instructions (Signed)

## 2015-12-05 NOTE — ED Notes (Signed)
Patient able to ambulate independently.  Pt refused to sgin.  Patient is unhappy with pain medication prescription.  Pt states "it won't do me no good".  This RN explained why anti-inflammatories are good for this kind of injury and why narcotics are not appropriate.  Patient verbalized understanding of discharge paperwork and discharge instructions/follow-up

## 2015-12-05 NOTE — ED Provider Notes (Signed)
CSN: [id number]     Arrival date & time 12/05/15  2225 History  By signing my name below, I, [facility], attest that this documentation has been prepared under the direction and in the presence of Domenic Moras, PA-C. Electronically Signed: Virgel Bouquet, ED Scribe. 12/05/2015. 11:21 PM.   Chief Complaint  Patient presents with  . Ankle Pain    The history is provided by the patient. No language interpreter was used.   HPI Comments: Mariah Wallace is a 30 y.o. female who presents to the Emergency Department complaining of constant, gradually worsening, sharp, non-radiating right ankle pain onset earlier today after right ankle injury. Patient reports that she was standing on a porch in high heel shoes and that the heel of her shoe went through a crack without pain, followed by gradual onset of pain since the injury. She endorses difficult ambulating secondary to pain and notes that pain is severe enough that she collapses when she attempts to ambulate. Pain is worse with bearing and movement. She has not taken any medications or tried any treatments. She denies pregnancy currently. She denies right knee pain or right foot pain. NKDA.  Past Medical History  Diagnosis Date  . Urinary tract infection   . Medical history non-contributory    Past Surgical History  Procedure Laterality Date  . Dilation and curettage of uterus     Family History  Problem Relation Age of Onset  . Hypertension Mother   . Cancer Paternal Uncle    Social History  Substance Use Topics  . Smoking status: Current Every Day Smoker    Last Attempt to Quit: 03/16/2013  . Smokeless tobacco: Never Used  . Alcohol Use: Yes     Comment: occ   OB History    Gravida Para Term Preterm AB TAB SAB Ectopic Multiple Living   3 1 1  0 2 2 0 0 0 1     Review of Systems  Musculoskeletal: Positive for arthralgias (right ankle).       Negative for right knee and right foot pain.      Allergies  Review of  patient's allergies indicates no known allergies.  Home Medications   Prior to Admission medications   Medication Sig Start Date End Date Taking? Authorizing Provider  cyclobenzaprine (FLEXERIL) 10 MG tablet Take 1 tablet (10 mg total) by mouth 3 (three) times daily as needed for muscle spasms. 10/02/15   Nolon Rod, DO  methylergonovine (METHERGINE) 0.2 MG tablet Take 1 tablet (0.2 mg total) by mouth 3 (three) times daily. Patient not taking: Reported on 09/12/2015 06/06/14   Rolland Porter, MD  metroNIDAZOLE (FLAGYL) 500 MG tablet Take 1 tablet (500 mg total) by mouth 2 (two) times daily. One po bid x 7 days Patient not taking: Reported on 10/02/2015 09/12/15   Mercedes Camprubi-Soms, PA-C  naproxen (NAPROSYN) 500 MG tablet Take 1 tablet (500 mg total) by mouth 2 (two) times daily as needed. 10/02/15   Tamela Oddi Hess, DO  sulfamethoxazole-trimethoprim (BACTRIM DS,SEPTRA DS) 800-160 MG tablet Take 1 tablet by mouth 2 (two) times daily. Patient not taking: Reported on 10/02/2015 09/12/15   Mercedes Camprubi-Soms, PA-C  traMADol (ULTRAM) 50 MG tablet Take 1 tablet (50 mg total) by mouth every 6 (six) hours as needed. 10/02/15   Bryan R Hess, DO   BP 138/66 mmHg  Pulse 90  Temp(Src) 98.2 F (36.8 C) (Oral)  Resp 18  SpO2 100%  LMP 11/23/2015 Physical Exam  Constitutional: She  is oriented to person, place, and time. She appears well-developed and well-nourished. No distress.  HENT:  Head: Normocephalic and atraumatic.  Eyes: Conjunctivae and EOM are normal.  Neck: Neck supple. No tracheal deviation present.  Cardiovascular: Normal rate.   Pulmonary/Chest: Effort normal. No respiratory distress.  Musculoskeletal: Normal range of motion.  Right foot TTP noted to dorsum proximal to the ankle with no crepitus. Normal dorsiflexion. Pain with plantar flexion. DP pulse brisk with good capillary refill. Calf compartment soft.  Neurological: She is alert and oriented to person, place, and time.  Skin: Skin is  warm and dry.  Psychiatric: She has a normal mood and affect. Her behavior is normal.  Nursing note and vitals reviewed.   ED Course  Procedures   DIAGNOSTIC STUDIES: Oxygen Saturation is 100% on RA, normal by my interpretation.    COORDINATION OF CARE: 11:12 PM Will order pain medication. Will return to discuss right ankle imaging results and discuss treatment options with pt. Discussed treatment plan with pt at bedside and pt agreed to plan.   Imaging Review Dg Ankle Complete Right  12/05/2015  CLINICAL DATA:  Ankle injury EXAM: RIGHT ANKLE - COMPLETE 3+ VIEW COMPARISON:  None. FINDINGS: There is no evidence of fracture, dislocation, or joint effusion. There is no evidence of arthropathy or other focal bone abnormality. Soft tissues are unremarkable. IMPRESSION: Negative. Electronically Signed   By: Franchot Gallo M.D.   On: 12/05/2015 23:14   I have personally reviewed and evaluated these images and lab results as part of my medical decision-making.   MDM   Final diagnoses:  Right ankle sprain, initial encounter    BP 138/66 mmHg  Pulse 90  Temp(Src) 98.2 F (36.8 C) (Oral)  Resp 18  SpO2 100%  LMP 11/23/2015  I personally performed the services described in this documentation, which was scribed in my presence. The recorded information has been reviewed and is accurate.     Domenic Moras, PA-C 12/05/15 2345  Ripley Fraise, MD 12/06/15 1400

## 2016-09-28 DIAGNOSIS — C50919 Malignant neoplasm of unspecified site of unspecified female breast: Secondary | ICD-10-CM

## 2016-09-28 HISTORY — DX: Malignant neoplasm of unspecified site of unspecified female breast: C50.919

## 2016-11-25 ENCOUNTER — Emergency Department (HOSPITAL_BASED_OUTPATIENT_CLINIC_OR_DEPARTMENT_OTHER)
Admission: EM | Admit: 2016-11-25 | Discharge: 2016-11-25 | Disposition: A | Discharge status: Home / Self Care | Payer: Medicaid Other | Attending: Emergency Medicine | Admitting: Emergency Medicine

## 2016-11-25 ENCOUNTER — Encounter (HOSPITAL_BASED_OUTPATIENT_CLINIC_OR_DEPARTMENT_OTHER): Payer: Self-pay | Admitting: *Deleted

## 2016-11-25 DIAGNOSIS — R05 Cough: Secondary | ICD-10-CM | POA: Insufficient documentation

## 2016-11-25 DIAGNOSIS — Z79899 Other long term (current) drug therapy: Secondary | ICD-10-CM | POA: Insufficient documentation

## 2016-11-25 DIAGNOSIS — F17200 Nicotine dependence, unspecified, uncomplicated: Secondary | ICD-10-CM | POA: Insufficient documentation

## 2016-11-25 DIAGNOSIS — Z791 Long term (current) use of non-steroidal anti-inflammatories (NSAID): Secondary | ICD-10-CM | POA: Insufficient documentation

## 2016-11-25 DIAGNOSIS — R0982 Postnasal drip: Secondary | ICD-10-CM | POA: Insufficient documentation

## 2016-11-25 DIAGNOSIS — N3000 Acute cystitis without hematuria: Secondary | ICD-10-CM | POA: Insufficient documentation

## 2016-11-25 LAB — URINALYSIS, ROUTINE W REFLEX MICROSCOPIC
Bilirubin Urine: NEGATIVE
Glucose, UA: NEGATIVE mg/dL
KETONES UR: 15 mg/dL — AB
NITRITE: POSITIVE — AB
PROTEIN: NEGATIVE mg/dL
Specific Gravity, Urine: 1.02 (ref 1.005–1.030)
pH: 5.5 (ref 5.0–8.0)

## 2016-11-25 LAB — URINALYSIS, MICROSCOPIC (REFLEX)

## 2016-11-25 MED ORDER — BENZONATATE 100 MG PO CAPS: 100.0000 mg | ORAL_CAPSULE | Freq: Three times a day (TID) | ORAL | 0 refills | Status: DC

## 2016-11-25 MED ORDER — IBUPROFEN 800 MG PO TABS
800.0000 mg | ORAL_TABLET | Freq: Once | ORAL | Status: AC
Start: 2016-11-25 — End: 2016-11-25
  Administered 2016-11-25: 800 mg via ORAL
  Filled 2016-11-25: qty 1

## 2016-11-25 MED ORDER — FLUTICASONE PROPIONATE 50 MCG/ACT NA SUSP: 2.0000 | Freq: Every day | NASAL | 0 refills | Status: DC

## 2016-11-25 MED ORDER — ACETAMINOPHEN 500 MG PO TABS
1000.0000 mg | ORAL_TABLET | Freq: Once | ORAL | Status: AC
Start: 2016-11-25 — End: 2016-11-25
  Administered 2016-11-25: 1000 mg via ORAL
  Filled 2016-11-25: qty 2

## 2016-11-25 MED ORDER — BENZONATATE 100 MG PO CAPS
200.0000 mg | ORAL_CAPSULE | Freq: Once | ORAL | Status: AC
  Administered 2016-11-25: 200 mg via ORAL
  Filled 2016-11-25: qty 2

## 2016-11-25 MED ORDER — FOSFOMYCIN TROMETHAMINE 3 G PO PACK
3.0000 g | PACK | Freq: Once | ORAL | Status: AC
  Administered 2016-11-25: 3 g via ORAL
  Filled 2016-11-25: qty 3

## 2016-11-25 NOTE — ED Notes (Signed)
ED Provider at bedside. 

## 2016-11-25 NOTE — ED Provider Notes (Signed)
[place] DEPT [facility] Provider Note   CSN: [id number] Arrival date & time: 11/25/16  0539     History   Chief Complaint Chief Complaint  Patient presents with  . Cough    HPI Mariah Wallace is a 31 y.o. female.  The history is provided by the patient.  Dysuria   This is a recurrent problem. The current episode started more than 2 days ago. The problem occurs every urination. The problem has not changed since onset.The quality of the pain is described as burning. The pain is moderate. There has been no fever. She is sexually active. Pertinent negatives include no chills, no sweats and no flank pain. She has tried nothing for the symptoms. Her past medical history does not include kidney stones.  Cough  This is a new problem. The current episode started 2 days ago. The problem occurs every few hours. The problem has not changed since onset.The cough is non-productive. There has been no fever. Associated symptoms include myalgias. Pertinent negatives include no chest pain, no chills, no sweats, no weight loss, no headaches and no shortness of breath. She has tried nothing for the symptoms. The treatment provided no relief. Risk factors: none. Her past medical history does not include pneumonia or emphysema.    Past Medical History:  Diagnosis Date  . Medical history non-contributory   . Urinary tract infection     Patient Active Problem List   Diagnosis Date Noted  . Lumbar paraspinal muscle spasm 10/02/2015  . Active labor 10/18/2013  . 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 PUPPP (pruritic urticarial papules and plaques of pregnancy) 09/07/2013  . Sickle cell trait ([place]) 05/17/2013  . First trimester bleeding 05/17/2013  . Supervision of normal pregnancy in second trimester 05/17/2013  . GBS (group B streptococcus) UTI complicating pregnancy 0000000    Past Surgical History:  Procedure Laterality Date  . DILATION AND CURETTAGE OF UTERUS      OB  History    Gravida Para Term Preterm AB Living   3 1 1  0 2 1   SAB TAB Ectopic Multiple Live Births   0 2 0 0 1       Home Medications    Prior to Admission medications   Medication Sig Start Date End Date Taking? Authorizing Provider  benzonatate (TESSALON) 100 MG capsule Take 1 capsule (100 mg total) by mouth every 8 (eight) hours. 11/25/16   Geniyah Eischeid, MD  cyclobenzaprine (FLEXERIL) 10 MG tablet Take 1 tablet (10 mg total) by mouth 3 (three) times daily as needed for muscle spasms. 10/02/15   Bryan R Hess, DO  fluticasone (FLONASE) 50 MCG/ACT nasal spray Place 2 sprays into both nostrils daily. 11/25/16   Axle Parfait, MD  methylergonovine (METHERGINE) 0.2 MG tablet Take 1 tablet (0.2 mg total) by mouth 3 (three) times daily. Patient not taking: Reported on 09/12/2015 06/06/14   Rolland Porter, MD  metroNIDAZOLE (FLAGYL) 500 MG tablet Take 1 tablet (500 mg total) by mouth 2 (two) times daily. One po bid x 7 days Patient not taking: Reported on 10/02/2015 09/12/15   Mercedes Street, PA-C  naproxen (NAPROSYN) 500 MG tablet Take 1 tablet (500 mg total) by mouth 2 (two) times daily as needed. 12/05/15   Domenic Moras, PA-C  sulfamethoxazole-trimethoprim (BACTRIM DS,SEPTRA DS) 800-160 MG tablet Take 1 tablet by mouth 2 (two) times daily. Patient not taking: Reported on 10/02/2015 09/12/15   [facility], PA-C  traMADol (ULTRAM) 50 MG tablet Take 1  tablet (50 mg total) by mouth every 6 (six) hours as needed. 10/02/15   Nolon Rod, DO    Family History Family History  Problem Relation Age of Onset  . Hypertension Mother   . Cancer Paternal Uncle     Social History Social History  Substance Use Topics  . Smoking status: Current Every Day Smoker    Last attempt to quit: 03/16/2013  . Smokeless tobacco: Never Used  . Alcohol use Yes     Comment: occ     Allergies   Patient has no known allergies.   Review of Systems Review of Systems  Constitutional: Negative for chills and weight loss.   Eyes: Negative for photophobia.  Respiratory: Positive for cough. Negative for shortness of breath.   Cardiovascular: Negative for chest pain, palpitations and leg swelling.  Gastrointestinal: Negative for abdominal pain.  Genitourinary: Positive for dysuria. Negative for flank pain, pelvic pain, vaginal bleeding and vaginal discharge.  Musculoskeletal: Positive for myalgias. Negative for neck pain and neck stiffness.  Neurological: Negative for headaches.  All other systems reviewed and are negative.    Physical Exam Updated Vital Signs BP 117/79   Pulse 94   Temp 98.3 F (36.8 C) (Oral)   LMP 11/06/2016   SpO2 100%   Physical Exam  Constitutional: She is oriented to person, place, and time. She appears well-developed and well-nourished. No distress.  HENT:  Head: Normocephalic and atraumatic.  Eyes: Conjunctivae and EOM are normal. Pupils are equal, round, and reactive to light.  Neck: Normal range of motion. Neck supple.  Cardiovascular: Normal rate, regular rhythm and intact distal pulses.   Pulmonary/Chest: Effort normal and breath sounds normal. She has no wheezes. She has no rales.  Abdominal: Soft. Bowel sounds are normal. She exhibits no mass. There is no tenderness. There is no rebound and no guarding.  Musculoskeletal: Normal range of motion. She exhibits no edema, tenderness or deformity.  Neurological: She is alert and oriented to person, place, and time.  Skin: Skin is warm and dry. Capillary refill takes less than 2 seconds.  Psychiatric: She has a normal mood and affect.     ED Treatments / Results   Vitals:   11/25/16 0545  BP: 117/79  Pulse: 94  Temp: 98.3 F (36.8 C)    Labs (all labs ordered are listed, but only abnormal results are displayed) Labs Reviewed  URINALYSIS, ROUTINE W REFLEX MICROSCOPIC - Abnormal; Notable for the following:       Result Value   APPearance CLOUDY (*)    Hgb urine dipstick MODERATE (*)    Ketones, ur 15 (*)     Nitrite POSITIVE (*)    Leukocytes, UA MODERATE (*)    All other components within normal limits  URINALYSIS, MICROSCOPIC (REFLEX) - Abnormal; Notable for the following:    Bacteria, UA MANY (*)    Squamous Epithelial / LPF 6-30 (*)    All other components within normal limits    Procedures Procedures (including critical care time)  Medications Ordered in ED Medications  benzonatate (TESSALON) capsule 200 mg (200 mg Oral Given 11/25/16 0553)  acetaminophen (TYLENOL) tablet 1,000 mg (1,000 mg Oral Given 11/25/16 0553)  ibuprofen (ADVIL,MOTRIN) tablet 800 mg (800 mg Oral Given 11/25/16 0553)  fosfomycin (MONUROL) packet 3 g (3 g Oral Given 11/25/16 [id number])       Final Clinical Impressions(s) / ED Diagnoses   Final diagnoses:  PND (post-nasal drip)  Acute cystitis without hematuria   The  patient is nontoxic-appearing on exam and vital signs are normal. After history, exam, and medical workup I feel the patient has been appropriately medically screened and is safe for discharge home. Pertinent diagnoses were discussed with the patient. Patient was given return precautions.  Strict return precautions for chest pain, dyspnea on exertion, shortness of breath, palpitations weakness, fever or any concerns.    New Prescriptions Discharge Medication List as of 11/25/2016  6:33 AM    START taking these medications   Details  benzonatate (TESSALON) 100 MG capsule Take 1 capsule (100 mg total) by mouth every 8 (eight) hours., Starting Wed 11/25/2016, Print    fluticasone (FLONASE) 50 MCG/ACT nasal spray Place 2 sprays into both nostrils daily., Starting Wed 11/25/2016, Print         Lailany Enoch, MD 11/25/16 [phone]

## 2016-11-25 NOTE — ED Triage Notes (Signed)
Pt reports upper body pain ("chest, side and back") from coughing for two days.  Has been taking mucinex for cough without relief. No other meds prior to arrival.

## 2017-09-16 HISTORY — PX: BREAST LUMPECTOMY: SHX2

## 2017-10-22 ENCOUNTER — Encounter: Payer: Self-pay | Admitting: *Deleted

## 2018-03-08 ENCOUNTER — Encounter: Payer: Self-pay | Admitting: Radiation Oncology

## 2018-03-09 ENCOUNTER — Other Ambulatory Visit: Payer: Self-pay | Admitting: Radiation Oncology

## 2018-03-09 ENCOUNTER — Ambulatory Visit
Admission: RE | Admit: 2018-03-09 | Discharge: 2018-03-09 | Disposition: A | Discharge status: Home / Self Care | Payer: Self-pay | Source: Ambulatory Visit | Attending: Radiation Oncology | Admitting: Radiation Oncology

## 2018-03-09 DIAGNOSIS — C50911 Malignant neoplasm of unspecified site of right female breast: Secondary | ICD-10-CM

## 2018-03-14 NOTE — Progress Notes (Signed)
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  No complaints of breast pain, discharge, or skin changes at that time.  MMG Right Breast 07/30/2017: right breast 1.9 x 1.2 x 1.7 cm oval mass with microlobulated margins and prominent internal vascularity.  This is located at 1:00 of the right breast, 4 cm from the nipple.  Due to its suspicious sonographic appearance, a bilateral mammogram was also obtained.  The mammogram was negative for suspicious microcalcifications or architectural distortion.  Breast MRI 08/27/2017: enhancing mass in UIQ right breast measuring 2.8 x 2.0 x 2.0 cm.  There is a adjacent 0.6 cm area of enhancement just inferior and anterior to the index mass demonstrating type I persistent kinetics, which may represent a satellite lesion or may represent background enhancement.  No evidence of axillary or internal mammary lymphadenopathy is seen.  There is no abnormal skin, nipple, or pectoralis muscle enhancement.  Left breast negative.   Histology per Pathology Report: Right Breast 08/17/2017  Receptor Status: ER(- 0%), PR (- <1%), Her2-neu (-), Ki-()  Past/Anticipated interventions by surgeon, if any: Dr. Ernst Bowler Right Lumpectomy 09/16/17  Past/Anticipated interventions by medical oncology, if any: Chemotherapy  Dr. 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 Ali Lowe- [place] 03/07/18 -We discussed the rationale,  logistics, and potential side effects of breast irradiation, underscoring that the purpose of radiation therapy is to decrease the likelihood of a local or regional recurrence.  -She certainly needs adjuvant radiation in the breast conservation setting after mastectomy.  -One question is in regards to radiation fields and whether or not to include the regional nodes. She does have some risk factors including being young (32 years old), triple negative, and also Grade 3 tumor. Technically she would meet the MA20 criteria for high risk node negative - a trial which showed a benefit to nodal irradiation.  -However, this was a small subset (~10%) of the study cohort and Ms. Lambright does have the favorable features of being <T3, LVI-, Margins-, Node negative.  -In sum, I favor whole breast (tangents alone) for her.  -She desires radiation closer to home, a decision which we support. We will refer her to [place] with Tyler Pita, MD.    Lymphedema issues, if any:  No  Pain issues, if any: No  BP 98/66 (BP Location: Right Arm, Patient Position: Sitting, Cuff Size: Normal)   Pulse (!) 59   Temp (!) 97 F (36.1 C) (Oral)   Resp 18   Ht _0  (1.575 m)   Wt 97 lb (44 kg)   SpO2 100%   BMI 17.74 kg/m    Wt Readings from Last 3 Encounters:  03/15/18 97 lb (44 kg)  10/02/15 94 lb (42.6 kg)  10/18/13 136 lb (61.7 kg)   SAFETY ISSUES:  Prior radiation? No  Pacemaker/ICD? No  Possible current pregnancy? No  Is the patient on methotrexate? 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  She participated in a research study 09/08/2017: -Research Study: Window of opportunity trial of entinostat in patients with newly diagnosed Stage I-IIIC, TNBC -Treatment Protocol: STUDY OVAN1916 IRB# [id number] PART 1: ENTINOSTAT (v. 02/10/16)        Cori Razor, RN 03/14/2018,9:59 AM

## 2018-03-15 ENCOUNTER — Encounter: Payer: Self-pay | Admitting: Radiation Oncology

## 2018-03-15 ENCOUNTER — Ambulatory Visit
Admission: RE | Admit: 2018-03-15 | Discharge: 2018-03-15 | Disposition: A | Discharge status: Home / Self Care | Payer: Self-pay | Source: Ambulatory Visit | Attending: Radiation Oncology | Admitting: Radiation Oncology

## 2018-03-15 ENCOUNTER — Other Ambulatory Visit: Payer: Self-pay

## 2018-03-15 VITALS — BP 98/66 | HR 59 | Temp 97.0°F | Resp 18 | Ht 62.0 in | Wt 97.0 lb

## 2018-03-15 DIAGNOSIS — Z79899 Other long term (current) drug therapy: Secondary | ICD-10-CM | POA: Insufficient documentation

## 2018-03-15 DIAGNOSIS — C50411 Malignant neoplasm of upper-outer quadrant of right female breast: Secondary | ICD-10-CM

## 2018-03-15 DIAGNOSIS — Z171 Estrogen receptor negative status [ER-]: Principal | ICD-10-CM

## 2018-03-15 DIAGNOSIS — Z8249 Family history of ischemic heart disease and other diseases of the circulatory system: Secondary | ICD-10-CM | POA: Insufficient documentation

## 2018-03-15 DIAGNOSIS — Z809 Family history of malignant neoplasm, unspecified: Secondary | ICD-10-CM | POA: Insufficient documentation

## 2018-03-15 DIAGNOSIS — C50211 Malignant neoplasm of upper-inner quadrant of right female breast: Secondary | ICD-10-CM | POA: Insufficient documentation

## 2018-03-15 DIAGNOSIS — Z87891 Personal history of nicotine dependence: Secondary | ICD-10-CM | POA: Insufficient documentation

## 2018-03-15 NOTE — Progress Notes (Signed)
Met with [name] today to get signed up for [employer] rides and [employer].  She will e-mail her pay stub later today.  I also referred her to patient accounting to get set up for financial assistance since she is uninsured.

## 2018-03-15 NOTE — Progress Notes (Signed)
Radiation Oncology         [phone] ________________________________  Name: Mariah Wallace        MRN: [id number]  Date of Service: 03/15/2018 DOB: November 21, 1985  YY:[id number], Provider, MD  Collier Bullock, MD     REFERRING PHYSICIAN: Collier Bullock, MD   DIAGNOSIS: The encounter diagnosis was Malignant neoplasm of upper-inner quadrant of right breast in female, estrogen receptor negative ([place]).   HISTORY OF PRESENT ILLNESS: Mariah Wallace is a 32 y.o. female seen with a history of right breast cancer. The patient was noted to have a palpable mass at 1:00 at the right breast. This prompted biopsy on 08/17/17 revealing grade 3 invasive ductal carcinoma, ER/PR negative, HER2 negative. 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 She completed AC/Taxol chemotherapy between 11/05/17-02/25/18. She elected to proceed with adjuvant radiotherapy closer to home since she was relocating back to [place]. She comes today to discuss this therapy.    PREVIOUS RADIATION THERAPY: No   PAST MEDICAL HISTORY:  Past Medical History:  Diagnosis Date  . Medical history non-contributory   . Urinary tract infection        PAST SURGICAL HISTORY: Past Surgical History:  Procedure Laterality Date  . BREAST LUMPECTOMY Right 09/16/2017  . DILATION AND CURETTAGE OF UTERUS       FAMILY HISTORY:  Family History  Problem Relation Age of Onset  . Hypertension Mother   . Cancer Paternal Uncle      SOCIAL HISTORY:  reports that she quit smoking about 5 years ago. She has never used smokeless tobacco. She reports that she drinks alcohol. She reports that she has current or past drug history. Drug: Marijuana. The patient is single. She now lives in [place] and will be working at [employer]. She had  been living with her mother in [place] who works at [employer] and resides there. The patient stayed locally in [place] to have support of her mother while undergoing treatment but plans to remain in [place]. She has a 31 year old daughter who lives with a grandparent.   ALLERGIES: Patient has no known allergies.   MEDICATIONS:  Current Outpatient Medications  Medication Sig Dispense Refill  . dronabinol (MARINOL) 5 MG capsule Take by mouth.     No current facility-administered medications for this encounter.      REVIEW OF SYSTEMS: On review of systems, the patient reports that she is doing well overall. She denies any chest pain, shortness of breath, cough, fevers, chills, night sweats, unintended weight changes. She denies any bowel or bladder disturbances, and denies abdominal pain, nausea or vomiting. She denies any new musculoskeletal or joint aches or pains. A complete review of systems is obtained and is otherwise negative.     PHYSICAL EXAM:  Wt Readings from Last 3 Encounters:  03/15/18 97 lb (44 kg)  10/02/15 94 lb (42.6 kg)  10/18/13 136 lb (61.7 kg)   Temp Readings from Last 3 Encounters:  03/15/18 (!) 97 F (36.1 C) (Oral)  11/25/16 98.3 F (36.8 C) (Oral)  12/05/15 98.2 F (36.8 C) (Oral)   BP Readings from Last 3 Encounters:  03/15/18 98/66  11/25/16 117/79  12/05/15 138/66   Pulse Readings from Last 3 Encounters:  03/15/18 (!) 59  11/25/16 94  12/05/15 90  In general this is a well appearing African American female in no acute distress. She is alert and oriented x4 and appropriate throughout the examination. HEENT reveals that the patient is normocephalic, atraumatic. EOMs are intact. PERRLA. Skin is intact without any evidence of gross lesions. Cardiopulmonary assessment is negative for acute distress and she exhibits normal effort. The left chest has an in situ PAC. She has a scar at the site of extravasation of prior hematoma about 1:00 in  the right breast. Her lumpectomy site is a hidden scar along the 10-1:00 site of the margin of the areola and her axillary incision site is well healed.     ECOG = 1  0 - Asymptomatic (Fully active, able to carry on all predisease activities without restriction)  1 - Symptomatic but completely ambulatory (Restricted in physically strenuous activity but ambulatory and able to carry out work of a light or sedentary nature. For example, light housework, office work)  2 - Symptomatic, <50% in bed during the day (Ambulatory and capable of all self care but unable to carry out any work activities. Up and about more than 50% of waking hours)  3 - Symptomatic, >50% in bed, but not bedbound (Capable of only limited self-care, confined to bed or chair 50% or more of waking hours)  4 - Bedbound (Completely disabled. Cannot carry on any self-care. Totally confined to bed or chair)  5 - Death   Eustace Pen MM, Creech RH, Tormey DC, et al. [phone]). "Toxicity and response criteria of the [facility]". [place] Oncol. 5 (6): 649-55    LABORATORY DATA:  Lab Results  Component Value Date   WBC 5.5 09/12/2015   HGB 11.5 (L) 09/12/2015   HCT 33.7 (L) 09/12/2015   MCV 78.9 09/12/2015   PLT 173 09/12/2015   Lab Results  Component Value Date   NA 139 09/12/2015   K 3.2 (L) 09/12/2015   CL 106 09/12/2015   CO2 26 09/12/2015   Lab Results  Component Value Date   ALT 11 10/18/2013   AST 16 10/18/2013   ALKPHOS 136 (H) 10/18/2013   BILITOT 0.4 10/18/2013      RADIOGRAPHY: No results found.     IMPRESSION/PLAN: 1. Stage IIA, pT2N0M0, grade 3, triple negative invasive ductal carcinoma of the right breast. Dr. Lisbeth Renshaw discusses the pathology findings and reviews the nature of HER2 breast disease. She is ready to proceed with radiotherapy to prevent local recurrence. We discussed the risks, benefits, short, and long term effects of radiotherapy, and the patient is interested in  proceeding. Dr. Lisbeth Renshaw discusses the delivery and logistics of radiotherapy and anticipates a course of 6 1/2 weeks of radiotherapy. Written consent is obtained and placed in the chart, a copy was provided to the patient. She will be contacted by our staff to coordinate her simulation. 2. Contraceptive counseling. The patient has not had a cycle since January 2019. She did receive Lupron in May 2019 and states she's due for her next shot this month. We will coordinate with breast navigator to help decide how to approach Lupron being given here in [place]. The patient is aware of the need to avoid pregnancy during radiotherapy, and we will proceed with urine pregnancy prior to simulation.  In a visit lasting 45 minutes, greater than 50% of the time was spent face to face discussing her case, and coordinating the patient's care.   The above documentation reflects my direct findings during this shared patient visit.  Please see the separate note by Dr. Lisbeth Renshaw on this date for the remainder of the patient's plan of care.    Carola Rhine, PAC

## 2018-03-16 ENCOUNTER — Encounter: Payer: Self-pay | Admitting: General Practice

## 2018-03-16 NOTE — Progress Notes (Signed)
[place] Psychosocial Distress Screening Clinical Social Work  Clinical Social Work was referred by distress screening protocol.  The patient scored a 7 on the Psychosocial Distress Thermometer which indicates moderate distress. Clinical Social Worker contacted patient by phone to assess for distress and other psychosocial needs. Now lives in [place] for duration of radiation treatments.  Small daughter lives w grandmother.  Started new job, plans to work throughout treatment.  Will need job to work around radiation treatments.  Can get to first appointment, may need help w subsequent visits.  Discussed [facility] services.  Patient will stop by after CT Sim to meet w CSW [place] to explore needs for help w transportation.  Mailed information packet on [employer].    ONCBCN DISTRESS SCREENING 03/15/2018  Screening Type Initial Screening  Distress experienced in past week (1-10) 7  Practical problem type Work/school;Transportation  Emotional problem type Depression;Nervousness/Anxiety;Isolation/feeling alone;Boredom  Physical Problem type Loss of appetitie;Tingling hands/feet    Clinical Social Worker follow up needed: No.  If yes, follow up plan:  Meet w CSW [place] to get help w transport and assessment of needs for additional financial support.      Beverely Pace, [place], LCSW Clinical Social Worker Phone:  [phone]

## 2018-03-18 ENCOUNTER — Telehealth: Payer: Self-pay | Admitting: Hematology

## 2018-03-18 NOTE — Telephone Encounter (Signed)
Pt has been scheduled to see Dr. Burr Medico on 6/25 at 1030am. Pt wanted her med onc appt scheduled on the same day as radiation treatment to avoid having to come back on multiple days. Pt is a transfer from [facility]. Records are in [facility]

## 2018-03-22 ENCOUNTER — Ambulatory Visit: Payer: Self-pay | Admitting: Radiation Oncology

## 2018-03-22 ENCOUNTER — Ambulatory Visit: Payer: Self-pay

## 2018-03-22 ENCOUNTER — Ambulatory Visit: Payer: Self-pay | Admitting: Hematology

## 2018-03-24 ENCOUNTER — Ambulatory Visit
Admission: RE | Admit: 2018-03-24 | Discharge: 2018-03-24 | Disposition: A | Discharge status: Home / Self Care | Payer: Self-pay | Source: Ambulatory Visit | Attending: Radiation Oncology | Admitting: Radiation Oncology

## 2018-03-24 DIAGNOSIS — C50211 Malignant neoplasm of upper-inner quadrant of right female breast: Secondary | ICD-10-CM

## 2018-03-24 DIAGNOSIS — Z171 Estrogen receptor negative status [ER-]: Secondary | ICD-10-CM | POA: Insufficient documentation

## 2018-03-24 DIAGNOSIS — C50411 Malignant neoplasm of upper-outer quadrant of right female breast: Secondary | ICD-10-CM | POA: Insufficient documentation

## 2018-03-24 LAB — PREGNANCY, URINE: PREG TEST UR: NEGATIVE

## 2018-03-24 NOTE — Progress Notes (Signed)
  Radiation Oncology         [phone]) [phone] ________________________________  Name: KALYSTA KNEISLEY MRN: [id number]  Date: 03/24/2018  DOB: [birth year]  Optical Surface Tracking Plan:  Since intensity modulated radiotherapy (IMRT) and 3D conformal radiation treatment methods are predicated on accurate and precise positioning for treatment, intrafraction motion monitoring is medically necessary to ensure accurate and safe treatment delivery.  The ability to quantify intrafraction motion without excessive ionizing radiation dose can only be performed with optical surface tracking. Accordingly, surface imaging offers the opportunity to obtain 3D measurements of patient position throughout IMRT and 3D treatments without excessive radiation exposure.  I am ordering optical surface tracking for this patient's upcoming course of radiotherapy. ________________________________  Kyung Rudd, MD 03/24/2018 4:16 PM    Reference:   Ursula Alert, J, et al. Surface imaging-based analysis of intrafraction motion for breast radiotherapy patients.Journal of [place], n. 6, nov. 2014. ISSN [id number].   Available at: <http://www.jacmp.org/index.php/jacmp/article/view/4957>.

## 2018-03-24 NOTE — Progress Notes (Signed)
  Radiation Oncology         [phone]) [phone] ________________________________  Name: Mariah Wallace MRN: [id number]  Date: 03/24/2018  DOB: 1985-10-10  DIAGNOSIS:     ICD-10-CM   1. Malignant neoplasm of upper-outer quadrant of right breast in female, estrogen receptor negative ([place]) C50.411    Z17.1      SIMULATION AND TREATMENT PLANNING NOTE  The patient presented for simulation prior to beginning her course of radiation treatment for her diagnosis of right-sided breast cancer. The patient was placed in a supine position on a breast board. A customized vac-lock bag was constructed and this complex treatment device will be used on a daily basis during her treatment. In this fashion, a CT scan was obtained through the chest area and an isocenter was placed near the chest wall within the breast.  The patient will be planned to receive a course of radiation initially to a dose of 50.4 Gy. This will consist of a whole breast radiotherapy technique. To accomplish this, 2 customized blocks have been designed which will correspond to medial and lateral whole breast tangent fields. This treatment will be accomplished at 1.8 Gy per fraction. A forward planning technique will also be evaluated to determine if this approach improves the plan. It is anticipated that the patient will then receive a 10 Gy boost to the seroma cavity which has been contoured. This will be accomplished at 2 Gy per fraction.   This initial treatment will consist of a 3-D conformal technique. The seroma has been contoured as the primary target structure. Additionally, dose volume histograms of both this target as well as the lungs and heart will also be evaluated. Such an approach is necessary to ensure that the target area is adequately covered while the nearby critical  normal structures are adequately spared.  Plan:  The final anticipated total dose therefore will correspond to 60.4  Gy.    _______________________________   Jodelle Gross, MD, PhD

## 2018-03-25 ENCOUNTER — Telehealth: Payer: Self-pay | Admitting: General Practice

## 2018-03-25 NOTE — Telephone Encounter (Signed)
[place] CSW Progress Note  Call to patient to determine if she needs help w transport, asked her to either return call or stop in at [facility] for needs assessment.  Was to come and see CSW [place] on Mon 6/24 but did not come.  CSWs standing by to assist w transport needs if pt desires.  Edwyna Shell, LCSW Clinical Social Worker Phone:  [phone]

## 2018-03-28 DIAGNOSIS — Z171 Estrogen receptor negative status [ER-]: Secondary | ICD-10-CM | POA: Insufficient documentation

## 2018-03-28 DIAGNOSIS — Z510 Encounter for antineoplastic radiation therapy: Secondary | ICD-10-CM | POA: Insufficient documentation

## 2018-03-28 DIAGNOSIS — C50411 Malignant neoplasm of upper-outer quadrant of right female breast: Secondary | ICD-10-CM | POA: Insufficient documentation

## 2018-04-01 ENCOUNTER — Ambulatory Visit: Payer: Self-pay | Admitting: Radiation Oncology

## 2018-04-04 ENCOUNTER — Encounter: Payer: Self-pay | Admitting: Radiation Oncology

## 2018-04-04 ENCOUNTER — Ambulatory Visit
Admission: RE | Admit: 2018-04-04 | Discharge: 2018-04-04 | Disposition: A | Discharge status: Home / Self Care | Payer: Self-pay | Source: Ambulatory Visit | Attending: Radiation Oncology | Admitting: Radiation Oncology

## 2018-04-05 ENCOUNTER — Telehealth: Payer: Self-pay | Admitting: Radiation Oncology

## 2018-04-05 ENCOUNTER — Ambulatory Visit: Payer: Self-pay

## 2018-04-05 NOTE — Telephone Encounter (Signed)
The patient began her first fraction to her left breast yesterday. After completing treatment, she went on to tell nursing that she was planning on bilateral mastectomies and reconstruction and wanted to know how long after radiation she would have mastectomies. After I read her note, I spoke with Manuela Schwartz, NP with Dr. Ernst Bowler in Surgical Oncology at [facility]. They were under the impression that the patient was not going to come for radiation. I called the patient and she confirmed that she wanted her breast to be more symmetric to the left side, and would rather have a mastectomy, especially so that she could avoid radiation. I let her know that we would discontinue radiation and I would let [facility] know. I contacted Manuela Schwartz to confirm the patient's plans. Dr. Lisbeth Renshaw is on board. We will follow up with her final pathology to make sure she doesn't need postmastectomy xrt given her high risk disease.     Carola Rhine, PAC

## 2018-04-06 ENCOUNTER — Ambulatory Visit: Payer: Self-pay

## 2018-04-07 ENCOUNTER — Ambulatory Visit: Payer: Self-pay

## 2018-04-07 ENCOUNTER — Inpatient Hospital Stay: Payer: Self-pay | Attending: Hematology | Admitting: Nurse Practitioner

## 2018-04-07 NOTE — Progress Notes (Deleted)
[place]  [phone] Fax:([phone]  Clinic New Consult Note   Patient Care Team: Default, Provider, MD as PCP - General 04/07/2018  CHIEF COMPLAINTS/PURPOSE OF CONSULTATION:  History of breat cancer  HISTORY OF PRESENTING ILLNESS:  Mariah Wallace 32 y.o. female is here because of history of breast cancer.  She is transferring care from [place].  Per outside records she self palpated a breast abnormality in October 2018 at age 60.  Diagnostic mammogram and ultrasound of the right breast demonstrating a 1.9 x 1.2 x 1.7 cm oval mass with microlobulated margins and prominent internal vascularity at the 1 o'clock position of the right breast, 4 cm from the nipple.  Of note she has extremely dense breast tissue.  She underwent right breast core biopsy on 08/24/2017 confirming invasive ductal carcinoma, grade 3, measuring 10 mm in greatest dimension.  ER/PR negative, HER-2 negative (1+) by IHC. she underwent bilateral breast MRI on 08/27/2017.  In the right breast there is a heterogeneously enhancing mass in the upper inner quadrant measuring 2.8 x 2.0 x 2.0 cm with central necrosis.  There is an adjacent 0.6 cm area of enhancement inferior and anterior to the index mass, which may represent a satellite lesion or background enhancement.  No right axillary or internal mammary lymphadenopathy was seen.  In the left breast there were no suspicious findings in the breast or axilla.  She was enrolled in Treatment Protocol: STUDY [id number] IRB# [phone] PART 1: ENTINOSTAT (v. 02/10/16) study, received her first treatment on 09/08/2017.  Per outside records she underwent genetics testing and had no evidence of mutation across 11 genes associated with hereditary breast cancer.  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 She underwent right partial mastectomy with sentinel lymph node biopsy  on 09/16/2017.  Pathology revealed tumor size 25 mm, T2N0, grade 3 disease.  Lymphovascular invasion was not identified, surgical margins were negative.  She then proceeded to adjuvant ddAC-T.   She had a PICC line placed for chemo and developed left upper extremity DVT, on apixaban..  Port-A-Cath was placed on 11/18/2017 prior to chemotherapy.  She began Lupron for fertility preservation. At completion of chemotherapy she was referred to radiation oncology. She received 1 dose at [facility] under Dr. Lisbeth Renshaw but discontinued due to plans for right nipple-sparing mastectomy and reconstruction. Diagnostic right mammogram on 03/18/18 showed stable density and distortion at the lumpectomy site in the right breast compatible with postsurgical changes.  There were no suspicious masses or abnormality seen in the right breast.  She is recommended to undergo diagnostic bilateral mammogram in 5 months.  Port-A-Cath was removed on 03/22/2018.  MEDICAL HISTORY:  Past Medical History:  Diagnosis Date  . Medical history non-contributory   . Urinary tract infection     SURGICAL HISTORY: Past Surgical History:  Procedure Laterality Date  . BREAST LUMPECTOMY Right 09/16/2017  . DILATION AND CURETTAGE OF UTERUS      SOCIAL HISTORY: Social History   Socioeconomic History  . Marital status: Single    Spouse name: Not on file  . Number of children: Not on file  . Years of education: Not on file  . Highest education level: Not on file  Occupational History  . Not on file  Social Needs  . Financial resource strain: Not on file  . Food insecurity:    Worry: Not on file    Inability: Not on file  .  Transportation needs:    Medical: Not on file    Non-medical: Not on file  Tobacco Use  . Smoking status: Former Smoker    Last attempt to quit: 03/16/2013    Years since quitting: 5.0  . Smokeless tobacco: Never Used  Substance and Sexual Activity  . Alcohol use: Yes    Comment: occ  . Drug use: Yes    Types:  Marijuana  . Sexual activity: Not Currently    Birth control/protection: None  Lifestyle  . Physical activity:    Days per week: Not on file    Minutes per session: Not on file  . Stress: Not on file  Relationships  . Social connections:    Talks on phone: Not on file    Gets together: Not on file    Attends religious service: Not on file    Active member of club or organization: Not on file    Attends meetings of clubs or organizations: Not on file    Relationship status: Not on file  . Intimate partner violence:    Fear of current or ex partner: Not on file    Emotionally abused: Not on file    Physically abused: Not on file    Forced sexual activity: Not on file  Other Topics Concern  . Not on file  Social History Narrative  . Not on file    FAMILY HISTORY: Family History  Problem Relation Age of Onset  . Hypertension Mother   . Cancer Paternal Uncle     ALLERGIES:  has No Known Allergies.  MEDICATIONS:  Current Outpatient Medications  Medication Sig Dispense Refill  . dronabinol (MARINOL) 5 MG capsule Take by mouth.     No current facility-administered medications for this visit.     REVIEW OF SYSTEMS:   Constitutional: Denies fevers, chills or abnormal night sweats Eyes: Denies blurriness of vision, double vision or watery eyes Ears, nose, mouth, throat, and face: Denies mucositis or sore throat Respiratory: Denies cough, dyspnea or wheezes Cardiovascular: Denies palpitation, chest discomfort or lower extremity swelling Gastrointestinal:  Denies nausea, heartburn or change in bowel habits Skin: Denies abnormal skin rashes Lymphatics: Denies new lymphadenopathy or easy bruising Neurological:Denies numbness, tingling or new weaknesses Behavioral/Psych: Mood is stable, no new changes  All other systems were reviewed with the patient and are negative.  PHYSICAL EXAMINATION: ECOG PERFORMANCE STATUS: {CHL ONC ECOG [phone]}  There were no vitals filed  for this visit. There were no vitals filed for this visit.  GENERAL:alert, no distress and comfortable SKIN: skin color, texture, turgor are normal, no rashes or significant lesions EYES: normal, conjunctiva are pink and non-injected, sclera clear OROPHARYNX:no exudate, no erythema and lips, buccal mucosa, and tongue normal  NECK: supple, thyroid normal size, non-tender, without nodularity LYMPH:  no palpable lymphadenopathy in the cervical, axillary or inguinal LUNGS: clear to auscultation and percussion with normal breathing effort HEART: regular rate & rhythm and no murmurs and no lower extremity edema ABDOMEN:abdomen soft, non-tender and normal bowel sounds Musculoskeletal:no cyanosis of digits and no clubbing  PSYCH: alert & oriented x 3 with fluent speech NEURO: no focal motor/sensory deficits  LABORATORY DATA:  I have reviewed the data as listed CBC Latest Ref Rng & Units 09/12/2015 06/06/2014 10/18/2013  WBC 4.0 - 10.5 K/uL 5.5 5.0 15.6(H)  Hemoglobin 12.0 - 15.0 g/dL 11.5(L) 10.8(L) 11.6(L)  Hematocrit 36.0 - 46.0 % 33.7(L) 30.9(L) 33.5(L)  Platelets 150 - 400 K/uL 173 193 203    @  cmpl@    Pathology  Surgical path 09/16/2017 Specimens: A) - Axilla, Right, #1, sentinel lymph node, right axillary  B) - Breast, Right, right ultrasound guided partial masectomy 1:00, 4CFN   C) - Breast, Right, anterior margin, right breast  D) - Breast, Right, posterior margin, right breast   E) - Breast, Right, superior margin, right breast  F) - Breast, Right, inferior margin, right breast  G) - Breast, Right, medial margin, right breast  H) - Breast, Right, lateral margin, right  breast     Final Diagnosis A: Right axilla, sentinel lymph node #1, biopsy - One lymph node negative for carcinoma (0/1)  B: Breast, right, partial mastectomy - Invasive ductal carcinoma (see synoptic report) - Tumor size: 25 mm in greatest dimension - Minor component of ductal carcinoma in situ, grade 3, solid type - Biopsy site changes present - Margin status for specimen A only (for final margin status see specimens C-H)  Invasive carcinoma: Positive medial margin; 0.5 mm from anterior margin; 1 mm from superior margin  Ductal carcinoma in situ: Negative, 2 mm from the closest (anterior and superior) margins - Ancillary studies previously reported on [id number]  Estrogen receptor: Negative  Progesterone receptor: Negative  HER2 IHC: Negative (1+)  C: Breast, right, anterior margin, excision -Negative for carcinoma  D: Breast, right, posterior margin, excision -Negative for carcinoma  E: Breast, right, superior margin, excision -Negative for carcinoma  F: Breast, right, inferior margin, excision -Negative for carcinoma  G: Breast, right, medial margin, excision -Negative for carcinoma  H: Breast, right, lateral margin, excision -Negative for carcinoma   Synoptic Report INVASIVE CARCINOMA OF THE BREAST(Breast Invasive - A)   SPECIMEN  Procedure:Excision (less than total mastectomy)   Specimen Laterality:Right   TUMOR  Tumor Site: Invasive Carcinoma:  Clock Position of Tumor Site:1 o'clock   Histologic Type:Invasive carcinoma of no special type (ductal, not otherwise specified)   Glandular (Acinar) / Tubular Differentiation:Score 3   Nuclear Pleomorphism:Score 3   Mitotic Rate:Score 3 (>=8 mitoses per mm2)   Overall Grade:Grade 3 (scores of 8 or 9)   Tumor Size:Greatest dimension of largest invasive focus in Millimeters (mm): 25 Millimeters (mm)  Tumor  Focality:Single focus of invasive carcinoma   Ductal Carcinoma In Situ (DCIS):Present   Architectural Patterns:Solid   Nuclear Grade:Grade III (high)   Necrosis:Not identified   Tumor Extent:  Accessory Findings:  Lymphovascular Invasion:Not identified   Dermal Lymphovascular Invasion:No skin present   Microcalcifications:Not identified   Treatment Effect:No known presurgical therapy   MARGINS  Invasive Carcinoma Margins:Uninvolved by invasive carcinoma   Closest Margin:See negative extended margins in specimens C-H   DCIS Margins:Uninvolved by DCIS   Closest Margin:See negative extended margins in specimens C-H   LYMPH NODES  Regional Lymph Nodes:Uninvolved by tumor cells   Number of Lymph Nodes Examined:1   Number of Sentinel Nodes Examined:1   PATHOLOGIC STAGE CLASSIFICATION (pTNM, AJCC 8th Edition)  TNM Descriptors:Not applicable   Primary Tumor (Invasive Carcinoma) (pT):pT2   Regional Lymph Nodes (pN):  Modifier:(sn): Only sentinel node(s) evaluated.   Category (pN):pN0   Comment(s)  Comment(s):Representative tumor in B8      Initial biopsy 08/24/2017 Final Diagnosis A: Breast, right, 1 o'clock, 4 to 5 cm from nipple, core biopsy (coil  clip) - Invasive ductal carcinoma - Nottingham combined histologic grade: 3 Tubule formation score: 3 Nuclear pleomorphism score: 3 Mitotic count score: 3 - No ductal carcinoma in situ identified - Invasive carcinoma measures 10 mm in greatest  linear extent in this core  biopsy specimen TEST(S) PERFORMED  :Estrogen Receptor (ER) Status   :Negative   :Rare tumor cells are weakly positive, <1%; internal control cells are also present and stain as expected   Test Type:Food and Drug Administration (FDA) cleared (test / vendor): [employer]     Primary Antibody:SP1   :Progesterone Receptor (PgR) Status   :Negative   :Rare tumor cells are moderately positive, <1%; internal control cells are also present and stain as expected   Test Type:Food and Drug Administration (FDA) cleared (test / vendor): [employer]   Primary Antibody:1E2   :HER2 by Immunohistochemistry   :Negative (Score 1+)   Test Type:Food and Drug Administration (FDA) cleared (test / vendor): [employer]   Primary Antibody:4B5   Cold Ischemia and Fixation Times:Meet requirements specified in latest version of the ASCO / CAP Guidelines   Cold Ischemia Time (minutes):1 min  Fixation Time (hours):8 hours  Testing Performed on Block Number(s):A1   RADIOGRAPHIC STUDIES: I have personally reviewed the radiological images as listed and agreed with the findings in the report. No results found.  Diagnostic mammogram and right breast ultrasound 08/25/2017 BREAST DENSITY:d - The breasts are extremely dense, which lowers the sensitivity of mammography. FINDINGS:A targeted ultrasound was first performed of the palpable area of concern. This demonstrates a 1.9 x 1.2 x 1.7 cm oval mass with microlobulated margins and prominent internal vascularity. This is located at 1:00 of the right breast, 4 cm from the nipple. Due to its suspicious sonographic appearance, a bilateral mammogram was also obtained. The mammogram is negative for suspicious microcalcifications or architectural distortion. ASSESSMENT: BI-RADS Category: 4B-Biopsy : Suspicious.Moderate suspicion for malignancy.Biopsy should be performed in the absence of clinical contraindication.  MRI breast 08/27/2017 Right breast: There is a heterogeneously enhancing mass in the upper inner quadrant of the right breast, measuring approximately 2.8 x 2.0 x 2.0 cm (transverse,AP, and craniocaudal dimensions). There is central  necrosis within the mass. The enhancement kinetics are primarily type I persistent with mild type II and type III kinetics. There is susceptibility artifact from a biopsy marker clip within the right breast mass.   There is an adjacent 0.6 cm area of enhancement just inferior and anterior to the index mass (approximately 0.4 cm from the primary mass) demonstrating type I persistent kinetics, which may represent a satellite lesion or may represent background enhancement.  No evidence of axillary or internal mammary lymphadenopathy is seen. There is no abnormal skin, nipple, or pectoralis muscle enhancement.  Left breast: There are no suspicious enhancing masses or areas of non-mass enhancement. No evidence of axillary or internal mammary lymphadenopathy is seen. There is no abnormal skin, nipple, or pectoralis muscle enhancement.  ASSESSMENT & PLAN:  *** No orders of the defined types were placed in this encounter.   All questions were answered. The patient knows to call the clinic with any problems, questions or concerns. I spent {CHL ONC TIME VISIT - ZOXWR:[id number]} counseling the patient face to face. The total time spent in the appointment was {CHL ONC TIME VISIT - BJYNW:[id number]} and more than 50% was on counseling.     Alla Feeling, NP 04/07/2018 8:57 AM

## 2018-04-08 ENCOUNTER — Ambulatory Visit: Payer: Self-pay

## 2018-04-11 ENCOUNTER — Ambulatory Visit: Payer: Self-pay

## 2018-04-12 ENCOUNTER — Ambulatory Visit: Payer: Self-pay

## 2018-04-13 ENCOUNTER — Ambulatory Visit: Payer: Self-pay

## 2018-04-14 ENCOUNTER — Ambulatory Visit: Payer: Self-pay

## 2018-04-15 ENCOUNTER — Telehealth: Payer: Self-pay | Admitting: Nurse Practitioner

## 2018-04-15 ENCOUNTER — Ambulatory Visit: Payer: Self-pay

## 2018-04-15 NOTE — Telephone Encounter (Signed)
lft vm to reschedule appt

## 2018-04-18 ENCOUNTER — Ambulatory Visit: Payer: Self-pay

## 2018-04-19 ENCOUNTER — Ambulatory Visit: Payer: Self-pay

## 2018-04-20 ENCOUNTER — Ambulatory Visit: Payer: Self-pay

## 2018-04-21 ENCOUNTER — Ambulatory Visit: Payer: Self-pay

## 2018-04-22 ENCOUNTER — Ambulatory Visit: Payer: Self-pay

## 2018-04-25 ENCOUNTER — Ambulatory Visit: Payer: Self-pay

## 2018-04-25 NOTE — Progress Notes (Signed)
  Radiation Oncology         [phone]) [phone] ________________________________  Name: Mariah Wallace MRN: [id number]  Date: 04/04/2018  DOB: [birth year]  End of Treatment Note  Diagnosis:   32 y.o. female with Stage IIA, pT2N0M0, grade 3, triple negative invasive ductal carcinoma of the right breast  Indication for treatment:  Curative       Radiation treatment dates:   04/04/2018  Site/dose:   Right Breast / 1.8 Gy in 1 fraction The patient was planned to receive an initial dose of 50.4 Gy in 28 fractions to the right breast using whole-breast tangent fields. This would be delivered using a 3-D conformal technique. The patient would have then received a boost to the seroma. This would deliver an additional 10 Gy in 5 fractions. The total planned dose was 60.4 Gy.  Narrative: The patient tolerated her first radiation treatment well.  However, after completing treatment, she stated that she was planning on receiving bilateral mastectomies and reconstruction at [facility].  Therefore, we discontinued her radiation and made [facility] aware.  Plan: The patient has discontinued radiation treatment. We will follow up with her final pathology to determine if she would need post-mastectomy radiation given her high risk disease. The patient will return to radiation oncology clinic for followup as needed. I advised the patient to call or return if they have any questions or concerns related to their treatment. ________________________________  Jodelle Gross, MD, PhD  This document serves as a record of services personally performed by Kyung Rudd, MD. It was created on his behalf by Rae Lips, a trained medical scribe. The creation of this record is based on the scribe's personal observations and the provider's statements to them. This document has been checked and approved by the attending provider.

## 2018-04-26 ENCOUNTER — Emergency Department (HOSPITAL_BASED_OUTPATIENT_CLINIC_OR_DEPARTMENT_OTHER)
Admission: EM | Admit: 2018-04-26 | Discharge: 2018-04-27 | Disposition: A | Discharge status: Home / Self Care | Payer: Self-pay | Attending: Emergency Medicine | Admitting: Emergency Medicine

## 2018-04-26 ENCOUNTER — Ambulatory Visit: Payer: Self-pay

## 2018-04-26 ENCOUNTER — Emergency Department (HOSPITAL_BASED_OUTPATIENT_CLINIC_OR_DEPARTMENT_OTHER): Payer: Self-pay

## 2018-04-26 DIAGNOSIS — Z87891 Personal history of nicotine dependence: Secondary | ICD-10-CM | POA: Insufficient documentation

## 2018-04-26 DIAGNOSIS — R0789 Other chest pain: Secondary | ICD-10-CM | POA: Insufficient documentation

## 2018-04-26 DIAGNOSIS — Z853 Personal history of malignant neoplasm of breast: Secondary | ICD-10-CM | POA: Insufficient documentation

## 2018-04-26 MED ORDER — HYDROCODONE-ACETAMINOPHEN 5-325 MG PO TABS
1.0000 | ORAL_TABLET | Freq: Once | ORAL | Status: AC
  Administered 2018-04-27: 1 via ORAL
  Filled 2018-04-26: qty 1

## 2018-04-26 NOTE — ED Triage Notes (Signed)
Pt states she had a port in the left upper chest removed approx 1 month ago. Pt states that she has had burning in her chest that is intermittent. Pt also c/o shortness of breath that started today. Pt stated that her care is normally done at [facility].

## 2018-04-26 NOTE — ED Triage Notes (Signed)
Pt states that she stopped taking her blood thinners (eliquis) approx 1 month ago also

## 2018-04-27 ENCOUNTER — Ambulatory Visit: Payer: Self-pay

## 2018-04-27 ENCOUNTER — Other Ambulatory Visit: Payer: Self-pay

## 2018-04-27 LAB — BASIC METABOLIC PANEL
Anion gap: 9 (ref 5–15)
BUN: 13 mg/dL (ref 6–20)
CHLORIDE: 105 mmol/L (ref 98–111)
CO2: 27 mmol/L (ref 22–32)
Calcium: 9.7 mg/dL (ref 8.9–10.3)
Creatinine, Ser: 0.87 mg/dL (ref 0.44–1.00)
GFR calc non Af Amer: >60 mL/min (ref >60)
GLUCOSE: 91 mg/dL (ref 70–99)
Potassium: 3.3 mmol/L — ABNORMAL LOW (ref 3.5–5.1)
Sodium: 141 mmol/L (ref 135–145)

## 2018-04-27 LAB — TROPONIN I
Troponin I: 0.04 ng/mL (ref <0.03)
Troponin I: 0.03 ng/mL (ref <0.03)

## 2018-04-27 LAB — D-DIMER, QUANTITATIVE: D-Dimer, Quant: <0.27 ug/mL-FEU (ref 0.00–0.50)

## 2018-04-27 MED ORDER — KETOROLAC TROMETHAMINE 30 MG/ML IJ SOLN
INTRAMUSCULAR | Status: AC
  Filled 2018-04-27: qty 1

## 2018-04-27 MED ORDER — KETOROLAC TROMETHAMINE 30 MG/ML IJ SOLN
30.0000 mg | Freq: Once | INTRAMUSCULAR | Status: AC
  Administered 2018-04-27: 30 mg via INTRAMUSCULAR
  Filled 2018-04-27: qty 1

## 2018-04-27 NOTE — ED Notes (Signed)
Date and time results received: 04/27/18 0109 (use smartphrase ".now" to insert current time)  Test: Troponin Critical Value: 0.04  Name of Provider Notified: Dr Dina Rich  Orders Received? Or Actions Taken?: None

## 2018-04-27 NOTE — ED Provider Notes (Signed)
[facility] EMERGENCY DEPARTMENT Provider Note   CSN: [id number] Arrival date & time: 04/26/18  2315     History   Chief Complaint Chief Complaint  Patient presents with  . Shortness of Breath  . Chest Pain    HPI Mariah Wallace is a 32 y.o. female.  HPI  This is a 32 year old female with a history of breast cancer who presents with chest pain.  Patient reports 1 month history of worsening left-sided chest pain.  Describes the pain is burning and intermittent.  Nothing seems to make it better or worse.  She also reports shortness of breath.  No fevers or cough.  She reports history of blood clots but is not currently on any anticoagulation.  This was associated with her cancer.  She has finished cancer treatment.  He had her port removed 1 month ago.  She currently rates her pain 8 out of 10.  She has not taken anything for the pain.  Denies any lower extremity swelling or pain.  Denies worsening with food.  Past Medical History:  Diagnosis Date  . Medical history non-contributory   . Urinary tract infection     Patient Active Problem List   Diagnosis Date Noted  . Malignant neoplasm of upper-outer quadrant of right breast in female, estrogen receptor negative ([place]) 03/15/2018  . Lumbar paraspinal muscle spasm 10/02/2015  . Active labor 10/18/2013  . 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 PUPPP (pruritic urticarial papules and plaques of pregnancy) 09/07/2013  . Sickle cell trait ([place]) 05/17/2013  . First trimester bleeding 05/17/2013  . Supervision of normal pregnancy in second trimester 05/17/2013  . GBS (group B streptococcus) UTI complicating pregnancy 82/50/5397    Past Surgical History:  Procedure Laterality Date  . BREAST LUMPECTOMY Right 09/16/2017  . DILATION AND CURETTAGE OF UTERUS       OB History    Gravida  3   Para  1   Term  1   Preterm  0   AB  2   Living  1     SAB  0   TAB  2   Ectopic  0   Multiple    0   Live Births  1            Home Medications    Prior to Admission medications   Medication Sig Start Date End Date Taking? Authorizing Provider  dronabinol (MARINOL) 5 MG capsule Take by mouth. 02/25/18   [provider]    Family History Family History  Problem Relation Age of Onset  . Hypertension Mother   . Cancer Paternal Uncle     Social History Social History   Tobacco Use  . Smoking status: Former Smoker    Last attempt to quit: 03/16/2013    Years since quitting: 5.1  . Smokeless tobacco: Never Used  Substance Use Topics  . Alcohol use: Yes    Comment: occ  . Drug use: Yes    Types: Marijuana     Allergies   Patient has no known allergies.   Review of Systems Review of Systems  Constitutional: Negative for fever.  Respiratory: Positive for shortness of breath. Negative for cough.   Cardiovascular: Positive for chest pain. Negative for palpitations and leg swelling.  Gastrointestinal: Negative for abdominal pain, nausea and vomiting.  Genitourinary: Negative for dysuria.  All other systems reviewed and are negative.    Physical Exam Updated Vital Signs BP 118/87   Pulse Marland Kitchen)  51   Temp 98 F (36.7 C) (Oral)   Resp 15   Ht 5\' 1"  (1.549 m)   Wt 45.4 kg (100 lb)   SpO2 100%   BMI 18.89 kg/m   Physical Exam  Constitutional: She is oriented to person, place, and time. She appears well-developed and well-nourished.  Nontoxic-appearing  HENT:  Head: Normocephalic and atraumatic.  Alopecia  Neck: Neck supple.  Cardiovascular: Normal rate, regular rhythm and normal heart sounds.  Decreased breast tissue on the right with scarring  Pulmonary/Chest: Effort normal. No respiratory distress. She has no wheezes.  Abdominal: Soft. Bowel sounds are normal.  Neurological: She is alert and oriented to person, place, and time.  Skin: Skin is warm and dry.  Psychiatric: She has a normal mood and affect.  Nursing note and vitals  reviewed.    ED Treatments / Results  Labs (all labs ordered are listed, but only abnormal results are displayed) Labs Reviewed  BASIC METABOLIC PANEL - Abnormal; Notable for the following components:      Result Value   Potassium 3.3 (*)    All other components within normal limits  TROPONIN I - Abnormal; Notable for the following components:   Troponin I 0.04 (*)    All other components within normal limits  TROPONIN I - Abnormal; Notable for the following components:   Troponin I 0.03 (*)    All other components within normal limits  D-DIMER, QUANTITATIVE (NOT AT [facility])    EKG EKG Interpretation  Date/Time:  Tuesday April 26 2018 23:49:17 EDT Ventricular Rate:  73 PR Interval:    QRS Duration: 71 QT Interval:  423 QTC Calculation: 467 R Axis:   60 Text Interpretation:  Sinus rhythm Confirmed by Thayer Jew [phone]) on 04/26/2018 11:59:08 PM   Radiology Dg Chest 2 View  Result Date: 04/26/2018 CLINICAL DATA:  Shortness of breath EXAM: CHEST - 2 VIEW COMPARISON:  01/03/2011, 12/17/2017 FINDINGS: The heart size and mediastinal contours are within normal limits. Both lungs are clear. The visualized skeletal structures are unremarkable. IMPRESSION: No active cardiopulmonary disease. Electronically Signed   By: Donavan Foil M.D.   On: 04/26/2018 23:55    Procedures Procedures (including critical care time)    EMERGENCY DEPARTMENT [place] CARDIAC EXAM "Study: Limited Ultrasound of the Heart and Pericardium"  INDICATIONS:Chest pain Multiple views of the heart and pericardium were obtained in real-time with a multi-frequency probe.  PERFORMED YB:WLSLHT IMAGES ARCHIVED?: Yes LIMITATIONS:  emergent procedure VIEWS USED: Parasternal long axis and Parasternal short axis INTERPRETATION: Pericardial effusioin absent and Normal contractility    Medications Ordered in ED Medications  ketorolac (TORADOL) 30 MG/ML injection (  Canceled Entry 04/27/18 0145)   HYDROcodone-acetaminophen (NORCO/VICODIN) 5-325 MG per tablet 1 tablet (1 tablet Oral Given 04/27/18 0029)  ketorolac (TORADOL) 30 MG/ML injection 30 mg (30 mg Intramuscular Given 04/27/18 0143)     Initial Impression / Assessment and Plan / ED Course  I have reviewed the triage vital signs and the nursing notes.  Pertinent labs & imaging results that were available during my care of the patient were reviewed by me and considered in my medical decision making (see chart for details).     She presents with intermittent chest pain for 1 month.  She is overall nontoxic-appearing and vital signs are reassuring.  No respiratory distress.  Satting 99% on room air.  Pain is very atypical.  EKG shows no evidence of arrhythmia or ischemia.  Initial troponin is minimally elevated 0.04.  Difficult to determine this relevance.  I highly doubt ACS in this otherwise low risk patient.  She does have a history of smoking.  Will repeat to ensure this is not trending upwards but do not feel that this reflects a true N STEMI.  Patient is currently cancer free.  This would make her risk of PE back to baseline.  D-dimer was sent and is negative.  This effectively rules out PE.  Chest x-ray shows no evidence of pneumonia, pneumothorax.  Bedside ultrasound shows no evidence of pericardial effusion.  Doubt pericarditis or myocarditis.  Patient much more comfortable after Norco.  Repeat troponin 0 0.03.  Again slightly elevated but stable.  I do not feel this truly reflects cardiac etiology.  Given burning nature of pain, GI etiology is a possibility.  Discussed results with patient.  Recommend follow-up with her primary physician and cardiology as an outpatient.  After history, exam, and medical workup I feel the patient has been appropriately medically screened and is safe for discharge home. Pertinent diagnoses were discussed with the patient. Patient was given return precautions.   Final Clinical Impressions(s) / ED  Diagnoses   Final diagnoses:  Atypical chest pain    ED Discharge Orders    None       Tannis Burstein, Barbette Hair, MD 04/27/18 [phone]

## 2018-04-27 NOTE — Discharge Instructions (Addendum)
You were seen today for chest pain.  Your work-up is largely reassuring.  Follow-up with your primary physician for ongoing concerns.  Also follow-up with cardiology if not improving.

## 2018-04-28 ENCOUNTER — Ambulatory Visit: Payer: Self-pay

## 2018-04-29 ENCOUNTER — Ambulatory Visit: Payer: Self-pay

## 2018-05-02 ENCOUNTER — Ambulatory Visit: Payer: Self-pay

## 2018-05-03 ENCOUNTER — Ambulatory Visit: Payer: Self-pay

## 2018-05-04 ENCOUNTER — Ambulatory Visit: Payer: Self-pay

## 2018-05-05 ENCOUNTER — Ambulatory Visit: Payer: Self-pay

## 2018-05-06 ENCOUNTER — Ambulatory Visit: Payer: Self-pay | Admitting: Radiation Oncology

## 2018-05-06 ENCOUNTER — Ambulatory Visit: Payer: Self-pay

## 2018-05-09 ENCOUNTER — Ambulatory Visit: Payer: Self-pay

## 2018-05-10 ENCOUNTER — Ambulatory Visit: Payer: Self-pay

## 2018-05-11 ENCOUNTER — Ambulatory Visit: Payer: Self-pay

## 2018-05-12 ENCOUNTER — Ambulatory Visit: Payer: Self-pay

## 2018-05-13 ENCOUNTER — Ambulatory Visit: Payer: Self-pay

## 2018-05-16 ENCOUNTER — Ambulatory Visit: Payer: Self-pay

## 2018-05-17 ENCOUNTER — Ambulatory Visit: Payer: Self-pay | Attending: Radiation Oncology

## 2018-05-18 ENCOUNTER — Ambulatory Visit: Payer: Self-pay

## 2018-08-14 ENCOUNTER — Encounter (HOSPITAL_BASED_OUTPATIENT_CLINIC_OR_DEPARTMENT_OTHER): Payer: Self-pay | Admitting: *Deleted

## 2018-08-14 ENCOUNTER — Emergency Department (HOSPITAL_BASED_OUTPATIENT_CLINIC_OR_DEPARTMENT_OTHER)
Admission: EM | Admit: 2018-08-14 | Discharge: 2018-08-15 | Disposition: A | Discharge status: Home / Self Care | Payer: Self-pay | Attending: Emergency Medicine | Admitting: Emergency Medicine

## 2018-08-14 ENCOUNTER — Other Ambulatory Visit: Payer: Self-pay

## 2018-08-14 DIAGNOSIS — Z79899 Other long term (current) drug therapy: Secondary | ICD-10-CM | POA: Insufficient documentation

## 2018-08-14 DIAGNOSIS — C50411 Malignant neoplasm of upper-outer quadrant of right female breast: Secondary | ICD-10-CM | POA: Insufficient documentation

## 2018-08-14 DIAGNOSIS — Z87891 Personal history of nicotine dependence: Secondary | ICD-10-CM | POA: Insufficient documentation

## 2018-08-14 DIAGNOSIS — R0789 Other chest pain: Secondary | ICD-10-CM | POA: Insufficient documentation

## 2018-08-14 HISTORY — DX: Malignant neoplasm of unspecified site of unspecified female breast: C50.919

## 2018-08-14 NOTE — ED Triage Notes (Signed)
Pt reports painful lump on right breast x 3 days in same location where she had a lumpectomy for breast CA last year

## 2018-08-15 MED ORDER — IBUPROFEN 600 MG PO TABS: 600.0000 mg | ORAL_TABLET | Freq: Four times a day (QID) | ORAL | 0 refills | Status: AC | PRN

## 2018-08-15 MED ORDER — KETOROLAC TROMETHAMINE 30 MG/ML IJ SOLN
30.0000 mg | Freq: Once | INTRAMUSCULAR | Status: AC
  Administered 2018-08-15: 30 mg via INTRAMUSCULAR
  Filled 2018-08-15: qty 1

## 2018-08-15 MED ORDER — CEPHALEXIN 500 MG PO CAPS
500.0000 mg | ORAL_CAPSULE | Freq: Four times a day (QID) | ORAL | 0 refills | Status: AC
Start: 2018-08-15 — End: ?

## 2018-08-15 NOTE — ED Provider Notes (Signed)
[facility] EMERGENCY DEPARTMENT Provider Note   CSN: [id number] Arrival date & time: 08/14/18  2350     History   Chief Complaint Chief Complaint  Patient presents with  . Breast Mass    HPI Mariah Wallace is a 32 y.o. female.  HPI  This is a 32 year old female with a history of breast cancer who presents with pain over prior lumpectomy site.  Patient reports 2 to 3-day history of worsening pain over her prior lumpectomy site.  She has had a lumpectomy as well as mastectomy with reconstruction.  She has not noted any redness over the site.  She has noted "warmth".  No documented fevers at home.  She has not noted any nipple discharge.  She rates her pain at 7 out of 10.  She states that it has become burning in nature.  It is nonradiating.  She is taking ibuprofen with minimal relief.  She is followed at [facility].  Past Medical History:  Diagnosis Date  . Breast cancer ([place])   . Medical history non-contributory   . Urinary tract infection     Patient Active Problem List   Diagnosis Date Noted  . Malignant neoplasm of upper-outer quadrant of right breast in female, estrogen receptor negative ([place]) 03/15/2018  . Lumbar paraspinal muscle spasm 10/02/2015  . Active labor 10/18/2013  . 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 PUPPP (pruritic urticarial papules and plaques of pregnancy) 09/07/2013  . Sickle cell trait ([place]) 05/17/2013  . First trimester bleeding 05/17/2013  . Supervision of normal pregnancy in second trimester 05/17/2013  . GBS (group B streptococcus) UTI complicating pregnancy 25/36/6440    Past Surgical History:  Procedure Laterality Date  . BREAST LUMPECTOMY Right 09/16/2017  . DILATION AND CURETTAGE OF UTERUS    . MASTECTOMY       OB History    Gravida  3   Para  1   Term  1   Preterm  0   AB  2   Living  1     SAB  0   TAB  2   Ectopic  0   Multiple  0   Live Births  1            Home Medications      Prior to Admission medications   Medication Sig Start Date End Date Taking? Authorizing Provider  cephALEXin (KEFLEX) 500 MG capsule Take 1 capsule (500 mg total) by mouth 4 (four) times daily. 08/15/18   Horton, Barbette Hair, MD  dronabinol (MARINOL) 5 MG capsule Take by mouth. 02/25/18   [provider]  ibuprofen (ADVIL,MOTRIN) 600 MG tablet Take 1 tablet (600 mg total) by mouth every 6 (six) hours as needed. 08/15/18   Horton, Barbette Hair, MD    Family History Family History  Problem Relation Age of Onset  . Hypertension Mother   . Cancer Paternal Uncle     Social History Social History   Tobacco Use  . Smoking status: Former Smoker    Last attempt to quit: 03/16/2013    Years since quitting: 5.4  . Smokeless tobacco: Never Used  Substance Use Topics  . Alcohol use: Yes    Comment: occ  . Drug use: Yes    Types: Marijuana     Allergies   Patient has no known allergies.   Review of Systems Review of Systems  Constitutional: Negative for fever.  Respiratory: Negative for shortness of breath.   Cardiovascular: Negative  for chest pain.       Breast pain  Gastrointestinal: Negative for abdominal pain.  Skin: Negative for color change and wound.     Physical Exam Updated Vital Signs BP 133/81 (BP Location: Left Arm)   Pulse 91   Temp 98.1 F (36.7 C) (Oral)   Resp 18   Ht 1.575 m (5\' 2" )   Wt 42.2 kg   SpO2 100%   BMI 17.01 kg/m   Physical Exam  Constitutional: She is oriented to person, place, and time. She appears well-developed and well-nourished. No distress.  HENT:  Head: Normocephalic and atraumatic.  Cardiovascular: Normal rate, regular rhythm and normal heart sounds.  Pulmonary/Chest: Effort normal and breath sounds normal. No respiratory distress. She exhibits tenderness.  Asymmetry of the breast noted likely secondary to prior reconstruction of the right breast, no inversion of the nipples noted    Neurological: She is alert and  oriented to person, place, and time.  Skin: Skin is warm and dry.  Psychiatric: She has a normal mood and affect.  Nursing note and vitals reviewed.    ED Treatments / Results  Labs (all labs ordered are listed, but only abnormal results are displayed) Labs Reviewed - No data to display  EKG None  Radiology No results found.  Procedures Procedures (including critical care time)  EMERGENCY DEPARTMENT US SOFT TISSUE INTERPRETATION "Study: Limited Soft Tissue Ultrasound"  INDICATIONS: Pain Multiple views of the body part were obtained in real-time with a multi-frequency linear probe  PERFORMED BY: Myself IMAGES ARCHIVED?: Yes SIDE:Left and Right  BODY PART:Breast INTERPRETATION:  Abcess present, No cellulitis noted and Normal soft tissue ultrasound    Medications Ordered in ED Medications  ketorolac (TORADOL) 30 MG/ML injection 30 mg (30 mg Intramuscular Given 08/15/18 0052)     Initial Impression / Assessment and Plan / ED Course  I have reviewed the triage vital signs and the nursing notes.  Pertinent labs & imaging results that were available during my care of the patient were reviewed by me and considered in my medical decision making (see chart for details).     Patient presents with tenderness to palpation over prior lumpectomy site.  She is overall nontoxic and afebrile.  Vital signs are reassuring.  She has some asymmetry of the breast but this is likely related to prior reconstruction.  No obvious fluctuance to indicate abscess.  Bedside ultrasound does not show any drainable fluid collection or significant cellulitis.  However, this could be early infection.  Will treat with anti-inflammatories.  Will start on Keflex and have her follow-up with her surgeon.  After history, exam, and medical workup I feel the patient has been appropriately medically screened and is safe for discharge home. Pertinent diagnoses were discussed with the patient. Patient was given  return precautions.   Final Clinical Impressions(s) / ED Diagnoses   Final diagnoses:  Chest wall pain    ED Discharge Orders         Ordered    cephALEXin (KEFLEX) 500 MG capsule  4 times daily     08/15/18 0054    ibuprofen (ADVIL,MOTRIN) 600 MG tablet  Every 6 hours PRN     08/15/18 0054           Merryl Hacker, MD 08/15/18 0100

## 2018-08-15 NOTE — Discharge Instructions (Addendum)
You were seen today for chest wall pain over the site of a prior lumpectomy.  At this time there is no drainable abscess or pocket of infection.  This could be early infection although you are well-appearing and without a fever.  He will be started on antibiotics.  Take ibuprofen as needed for pain.  Contact your breast surgeon for follow-up.  If you note increasing swelling, redness, warmth, develop a fever, you need to be reevaluated immediately.

## 2018-11-18 ENCOUNTER — Other Ambulatory Visit: Payer: Self-pay

## 2018-11-18 ENCOUNTER — Encounter (HOSPITAL_COMMUNITY): Payer: Self-pay | Admitting: *Deleted

## 2018-11-18 ENCOUNTER — Emergency Department (HOSPITAL_COMMUNITY)
Admission: EM | Admit: 2018-11-18 | Discharge: 2018-11-18 | Disposition: A | Discharge status: Home / Self Care | Payer: Self-pay | Attending: Emergency Medicine | Admitting: Emergency Medicine

## 2018-11-18 DIAGNOSIS — Z87891 Personal history of nicotine dependence: Secondary | ICD-10-CM | POA: Insufficient documentation

## 2018-11-18 DIAGNOSIS — R69 Illness, unspecified: Secondary | ICD-10-CM

## 2018-11-18 DIAGNOSIS — Z853 Personal history of malignant neoplasm of breast: Secondary | ICD-10-CM | POA: Insufficient documentation

## 2018-11-18 DIAGNOSIS — Z79899 Other long term (current) drug therapy: Secondary | ICD-10-CM | POA: Insufficient documentation

## 2018-11-18 DIAGNOSIS — J111 Influenza due to unidentified influenza virus with other respiratory manifestations: Secondary | ICD-10-CM | POA: Insufficient documentation

## 2018-11-18 MED ORDER — IBUPROFEN 200 MG PO TABS
600.0000 mg | ORAL_TABLET | Freq: Once | ORAL | Status: AC
  Administered 2018-11-18: 600 mg via ORAL
  Filled 2018-11-18: qty 3

## 2018-11-18 MED ORDER — ONDANSETRON 4 MG PO TBDP: ORAL_TABLET | ORAL | 0 refills | Status: AC

## 2018-11-18 MED ORDER — ONDANSETRON 4 MG PO TBDP
4.0000 mg | ORAL_TABLET | Freq: Once | ORAL | Status: AC
  Administered 2018-11-18: 4 mg via ORAL
  Filled 2018-11-18: qty 1

## 2018-11-18 MED ORDER — OSELTAMIVIR PHOSPHATE 75 MG PO CAPS: 75.0000 mg | ORAL_CAPSULE | Freq: Two times a day (BID) | ORAL | 0 refills | Status: AC

## 2018-11-18 NOTE — ED Triage Notes (Signed)
Pt reports chills, body aches, fatigue, nausea, and coughing since yesterday.  Pt denies any vomiting.  Pt a/o x 4 and ambulatory.

## 2018-11-18 NOTE — Discharge Instructions (Signed)
You have the flu this is a viral infection that will likely start to improve after 5-7 days, antibiotics are not helpful in treating viral infections.  You may take Tamiflu twice a day for the next 5 days this will help shorten the duration of the flu and prevent complications, this medication can cause some upset stomach, nausea and diarrhea. You may use Zofran as needed for nausea.  Please make sure you are drinking plenty of fluids. You can treat your symptoms supportively with tylenol/ibuprofen for fevers and pains, Zyrtec and Flonase to heal with nasal congestion, and over the counter cough syrups and throat lozenges to help with cough. If your symptoms are not improving please follow up with you Primary doctor.   If you develop persistent fevers, shortness of breath or difficulty breathing, chest pain, severe headache and neck pain, persistent nausea and vomiting or other new or concerning symptoms return to the Emergency department.

## 2018-11-18 NOTE — ED Provider Notes (Signed)
[facility] DEPT Provider Note   CSN: [id number] Arrival date & time: 11/18/18  1028    History   Chief Complaint Chief Complaint  Patient presents with  . Chills  . Cough  . Generalized Body Aches    HPI Mariah Wallace is a 33 y.o. female.     Mariah Wallace is a 34 y.o. female with a history of breast cancer currently in remission, who presents to the emergency department for evaluation of fever, chills, body aches, cough and congestion, symptoms started suddenly yesterday night and have been constant and worsening since onset.  She reports some soreness in her chest and abdomen from coughing but no persistent chest pain or shortness of breath.  Cough is occasionally productive of clear sputum.  She denies any abdominal pain has had some mild nausea but no vomiting.  Denies diarrhea.  She denies any specific sick contacts.  Did not receive flu vaccination this year.  Patient has not taken anything to treat her symptoms prior to arrival, no other aggravating or alleviating factors.     Past Medical History:  Diagnosis Date  . Breast cancer ([place])   . Medical history non-contributory   . Urinary tract infection     Patient Active Problem List   Diagnosis Date Noted  . Malignant neoplasm of upper-outer quadrant of right breast in female, estrogen receptor negative ([place]) 03/15/2018  . Lumbar paraspinal muscle spasm 10/02/2015  . Active labor 10/18/2013  . 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 PUPPP (pruritic urticarial papules and plaques of pregnancy) 09/07/2013  . Sickle cell trait ([place]) 05/17/2013  . First trimester bleeding 05/17/2013  . Supervision of normal pregnancy in second trimester 05/17/2013  . GBS (group B streptococcus) UTI complicating pregnancy 58/52/7782    Past Surgical History:  Procedure Laterality Date  . BREAST LUMPECTOMY Right 09/16/2017  . DILATION AND CURETTAGE OF UTERUS    . MASTECTOMY    .  PLACEMENT OF BREAST IMPLANTS       OB History    Gravida  3   Para  1   Term  1   Preterm  0   AB  2   Living  1     SAB  0   TAB  2   Ectopic  0   Multiple  0   Live Births  1            Home Medications    Prior to Admission medications   Medication Sig Start Date End Date Taking? Authorizing Provider  cephALEXin (KEFLEX) 500 MG capsule Take 1 capsule (500 mg total) by mouth 4 (four) times daily. 08/15/18   Horton, Barbette Hair, MD  dronabinol (MARINOL) 5 MG capsule Take by mouth. 02/25/18   [provider]  ibuprofen (ADVIL,MOTRIN) 600 MG tablet Take 1 tablet (600 mg total) by mouth every 6 (six) hours as needed. 08/15/18   Merryl Hacker, MD  ondansetron (ZOFRAN ODT) 4 MG disintegrating tablet 4mg  ODT q4 hours prn nausea/vomit 11/18/18   Jacqlyn Larsen, PA-C  oseltamivir (TAMIFLU) 75 MG capsule Take 1 capsule (75 mg total) by mouth every 12 (twelve) hours. 11/18/18   Jacqlyn Larsen, PA-C    Family History Family History  Problem Relation Age of Onset  . Hypertension Mother   . Cancer Paternal Uncle     Social History Social History   Tobacco Use  . Smoking status: Former Smoker    Last attempt  to quit: 03/16/2013    Years since quitting: 5.6  . Smokeless tobacco: Never Used  Substance Use Topics  . Alcohol use: Yes    Comment: occ  . Drug use: Yes    Types: Marijuana     Allergies   Patient has no known allergies.   Review of Systems Review of Systems  Constitutional: Positive for chills, fatigue and fever.  HENT: Positive for congestion, postnasal drip, rhinorrhea and sore throat.   Respiratory: Positive for cough. Negative for chest tightness, shortness of breath and wheezing.   Cardiovascular: Negative for chest pain.  Gastrointestinal: Positive for nausea. Negative for abdominal pain, constipation, diarrhea and vomiting.  Genitourinary: Negative for dysuria and frequency.  Musculoskeletal: Positive for myalgias. Negative  for arthralgias, neck pain and neck stiffness.  Skin: Negative for color change and rash.  All other systems reviewed and are negative.    Physical Exam Updated Vital Signs BP 124/89 (BP Location: Right Arm)   Pulse 93   Temp 100 F (37.8 C) (Oral)   Resp 15   Ht 5\' 2"  (1.575 m)   Wt 44.9 kg   LMP 10/25/2018   SpO2 96%   BMI 18.11 kg/m   Physical Exam Vitals signs and nursing note reviewed.  Constitutional:      General: She is not in acute distress.    Appearance: Normal appearance. She is well-developed and normal weight. She is not ill-appearing or diaphoretic.  HENT:     Head: Normocephalic and atraumatic.     Right Ear: Tympanic membrane and ear canal normal.     Left Ear: Tympanic membrane and ear canal normal.     Nose: Congestion and rhinorrhea present.     Mouth/Throat:     Mouth: Mucous membranes are moist.     Pharynx: Oropharynx is clear. Posterior oropharyngeal erythema present.  Eyes:     General:        Right eye: No discharge.        Left eye: No discharge.  Neck:     Musculoskeletal: Neck supple.     Comments: No rigidity Cardiovascular:     Rate and Rhythm: Normal rate and regular rhythm.     Pulses: Normal pulses.     Heart sounds: Normal heart sounds. No murmur. No friction rub. No gallop.   Pulmonary:     Effort: Pulmonary effort is normal. No respiratory distress.     Breath sounds: Normal breath sounds.     Comments: Respirations equal and unlabored, patient able to speak in full sentences, lungs clear to auscultation bilaterally Abdominal:     General: Bowel sounds are normal. There is no distension.     Palpations: Abdomen is soft. There is no mass.     Tenderness: There is no abdominal tenderness. There is no guarding.     Comments: Abdomen soft, nondistended, nontender to palpation in all quadrants without guarding or peritoneal signs  Musculoskeletal:        General: No deformity.  Lymphadenopathy:     Cervical: No cervical  adenopathy.  Skin:    General: Skin is warm and dry.     Capillary Refill: Capillary refill takes less than 2 seconds.  Neurological:     Mental Status: She is alert and oriented to person, place, and time.  Psychiatric:        Mood and Affect: Mood normal.        Behavior: Behavior normal.      ED Treatments / Results  Labs (all labs ordered are listed, but only abnormal results are displayed) Labs Reviewed - No data to display  EKG None  Radiology No results found.  Procedures Procedures (including critical care time)  Medications Ordered in ED Medications  ibuprofen (ADVIL,MOTRIN) tablet 600 mg (600 mg Oral Given 11/18/18 1137)  ondansetron (ZOFRAN-ODT) disintegrating tablet 4 mg (4 mg Oral Given 11/18/18 1137)     Initial Impression / Assessment and Plan / ED Course  I have reviewed the triage vital signs and the nursing notes.  Pertinent labs & imaging results that were available during my care of the patient were reviewed by me and considered in my medical decision making (see chart for details).  Patient with symptoms consistent with influenza.  Vitals are stable, low-grade fever. Improved with medication.  No signs of dehydration, tolerating PO's.   Lungs are clear. CXR without signs of pneumonia, or other active cardiopulmonary disease. Pt within 48 hour window, discussed cost vs. benefit of tamiflu, pt would like to take tamiflu. Patient will be discharged with instructions to orally hydrate, rest, and use over-the-counter medications such as anti-inflammatories ibuprofen and Aleve for muscle aches and Tylenol for fever.  Will be discharged with prescription for Zofran I discussed appropriate symptomatic treatment.  PCP follow-up encouraged.  Return precautions discussed.  Patient expresses understanding and agreement with plan.  Stable for discharge home at this time.  Final Clinical Impressions(s) / ED Diagnoses   Final diagnoses:  Influenza-like illness    ED  Discharge Orders         Ordered    oseltamivir (TAMIFLU) 75 MG capsule  Every 12 hours     11/18/18 1140    ondansetron (ZOFRAN ODT) 4 MG disintegrating tablet     11/18/18 1140           Benedetto Goad [place], [place] 11/18/18 1544    Maudie Flakes, MD 11/18/18 [phone]

## 2019-08-15 ENCOUNTER — Encounter (HOSPITAL_COMMUNITY): Payer: Self-pay | Admitting: Emergency Medicine

## 2019-08-15 ENCOUNTER — Other Ambulatory Visit: Payer: Self-pay

## 2019-08-15 ENCOUNTER — Emergency Department (HOSPITAL_COMMUNITY)
Admission: EM | Admit: 2019-08-15 | Discharge: 2019-08-15 | Disposition: A | Discharge status: Home / Self Care | Payer: Medicaid Other | Attending: Emergency Medicine | Admitting: Emergency Medicine

## 2019-08-15 ENCOUNTER — Emergency Department (HOSPITAL_COMMUNITY): Payer: Medicaid Other

## 2019-08-15 DIAGNOSIS — Z87891 Personal history of nicotine dependence: Secondary | ICD-10-CM | POA: Insufficient documentation

## 2019-08-15 DIAGNOSIS — R079 Chest pain, unspecified: Secondary | ICD-10-CM | POA: Insufficient documentation

## 2019-08-15 DIAGNOSIS — Z79899 Other long term (current) drug therapy: Secondary | ICD-10-CM | POA: Diagnosis not present

## 2019-08-15 LAB — BASIC METABOLIC PANEL
Anion gap: 11 (ref 5–15)
BUN: 14 mg/dL (ref 6–20)
CO2: 24 mmol/L (ref 22–32)
Calcium: 9.2 mg/dL (ref 8.9–10.3)
Chloride: 100 mmol/L (ref 98–111)
Creatinine, Ser: 0.83 mg/dL (ref 0.44–1.00)
GFR calc Af Amer: >60 mL/min (ref >60)
GFR calc non Af Amer: >60 mL/min (ref >60)
Glucose, Bld: 75 mg/dL (ref 70–99)
Potassium: 4.1 mmol/L (ref 3.5–5.1)
Sodium: 135 mmol/L (ref 135–145)

## 2019-08-15 LAB — CBC WITH DIFFERENTIAL/PLATELET
Abs Immature Granulocytes: 0.02 10*3/uL (ref 0.00–0.07)
Basophils Absolute: 0.1 10*3/uL (ref 0.0–0.1)
Basophils Relative: 1 %
Eosinophils Absolute: 0.1 10*3/uL (ref 0.0–0.5)
Eosinophils Relative: 2 %
HCT: 38.9 % (ref 36.0–46.0)
Hemoglobin: 13.1 g/dL (ref 12.0–15.0)
Immature Granulocytes: 0 %
Lymphocytes Relative: 30 %
Lymphs Abs: 1.7 10*3/uL (ref 0.7–4.0)
MCH: 27.6 pg (ref 26.0–34.0)
MCHC: 33.7 g/dL (ref 30.0–36.0)
MCV: 81.9 fL (ref 80.0–100.0)
Monocytes Absolute: 0.5 10*3/uL (ref 0.1–1.0)
Monocytes Relative: 8 %
Neutro Abs: 3.3 10*3/uL (ref 1.7–7.7)
Neutrophils Relative %: 59 %
Platelets: 221 10*3/uL (ref 150–400)
RBC: 4.75 MIL/uL (ref 3.87–5.11)
RDW: 12.2 % (ref 11.5–15.5)
WBC: 5.6 10*3/uL (ref 4.0–10.5)
nRBC: 0 % (ref 0.0–0.2)

## 2019-08-15 LAB — I-STAT BETA HCG BLOOD, ED (MC, WL, AP ONLY): I-stat hCG, quantitative: <5 m[IU]/mL (ref <5)

## 2019-08-15 LAB — TROPONIN I (HIGH SENSITIVITY): Troponin I (High Sensitivity): 3 ng/L (ref <18)

## 2019-08-15 LAB — D-DIMER, QUANTITATIVE (NOT AT ARMC): D-Dimer, Quant: <0.27 ug/mL-FEU (ref 0.00–0.50)

## 2019-08-15 MED ORDER — KETOROLAC TROMETHAMINE 15 MG/ML IJ SOLN: 15.0000 mg | Freq: Once | INTRAMUSCULAR | Status: DC

## 2019-08-15 MED ORDER — IBUPROFEN 600 MG PO TABS: 600.0000 mg | ORAL_TABLET | Freq: Four times a day (QID) | ORAL | 0 refills | Status: AC | PRN

## 2019-08-15 NOTE — ED Provider Notes (Signed)
[facility] EMERGENCY DEPARTMENT Provider Note   CSN: [id number] Arrival date & time: 08/15/19  1222     History   Chief Complaint Chest pain  HPI Mariah Wallace is a 33 y.o. female wth a past medical history of breast cancer status post chemotherapy and mastectomy, sickle cell trait, presenting to the emergency department with left-sided chest pain.  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  This started around 10 to 10:30 AM.  The pain has been constant and getting worse.  It is not pleuritic or associated with movement.  She is never felt the before.  It does not radiate anywhere.  It is a sharp stabbing pain in her left side under her breast.  She denies fevers, chills, coughing, or sick contacts at home.   She denies prior history of DVT or PE.  She denies lower leg calf pain or swelling.  She denies any history of MI or cardiac disease.  She states she quit smoking many years ago.    HPI  Past Medical History:  Diagnosis Date  . Breast cancer ([place]) 2018  . Medical history non-contributory   . Urinary tract infection     Patient Active Problem List   Diagnosis Date Noted  . Malignant neoplasm of upper-outer quadrant of right breast in female, estrogen receptor negative ([place]) 03/15/2018  . Lumbar paraspinal muscle spasm 10/02/2015  . Active labor 10/18/2013  . 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 PUPPP (pruritic urticarial papules and plaques of pregnancy) 09/07/2013  . Sickle cell trait ([place]) 05/17/2013  . First trimester bleeding 05/17/2013  . Supervision of normal pregnancy in second trimester 05/17/2013  . GBS (group B streptococcus) UTI complicating pregnancy 0000000    Past Surgical History:  Procedure Laterality Date  . BREAST LUMPECTOMY Right 09/16/2017  . DILATION AND CURETTAGE OF UTERUS    . MASTECTOMY    . PLACEMENT OF  BREAST IMPLANTS       OB History    Gravida  3   Para  1   Term  1   Preterm  0   AB  2   Living  1     SAB  0   TAB  2   Ectopic  0   Multiple  0   Live Births  1            Home Medications    Prior to Admission medications   Medication Sig Start Date End Date Taking? Authorizing Provider  cephALEXin (KEFLEX) 500 MG capsule Take 1 capsule (500 mg total) by mouth 4 (four) times daily. 08/15/18   Horton, Barbette Hair, MD  dronabinol (MARINOL) 5 MG capsule Take by mouth. 02/25/18   [provider]  ibuprofen (ADVIL) 600 MG tablet Take 1 tablet (600 mg total) by mouth every 6 (six) hours as needed for up to 30 doses for mild pain or moderate pain. 08/15/19   Wyvonnia Dusky, MD  ibuprofen (ADVIL,MOTRIN) 600 MG tablet Take 1 tablet (600 mg total) by mouth every 6 (six) hours as needed. 08/15/18   Merryl Hacker, MD  ondansetron (ZOFRAN ODT) 4 MG disintegrating tablet 4mg  ODT q4 hours prn nausea/vomit 11/18/18   Jacqlyn Larsen, PA-C  oseltamivir (TAMIFLU) 75 MG capsule Take 1 capsule (75 mg total) by mouth every 12 (twelve) hours. 11/18/18   Jacqlyn Larsen,  PA-C    Family History Family History  Problem Relation Age of Onset  . Hypertension Mother   . Cancer Paternal Uncle     Social History Social History   Tobacco Use  . Smoking status: Former Smoker    Quit date: 03/16/2013    Years since quitting: 6.4  . Smokeless tobacco: Never Used  Substance Use Topics  . Alcohol use: Yes    Comment: occ  . Drug use: Yes    Types: Marijuana     Allergies   Patient has no known allergies.   Review of Systems Review of Systems  Constitutional: Negative for chills and fever.  Respiratory: Negative for cough and shortness of breath.   Cardiovascular: Positive for chest pain. Negative for palpitations.  Gastrointestinal: Negative for nausea and vomiting.  Musculoskeletal: Negative for arthralgias and back pain.  Skin: Negative for pallor and rash.   Neurological: Negative for seizures and syncope.  All other systems reviewed and are negative.    Physical Exam Updated Vital Signs BP (!) 143/93 (BP Location: Right Arm)   Pulse 70   Temp 98 F (36.7 C) (Oral)   Resp 16   Ht 5\' 2"  (1.575 m)   Wt 45.4 kg   SpO2 100%   BMI 18.29 kg/m   Physical Exam Vitals signs and nursing note reviewed.  Constitutional:      General: She is not in acute distress.    Appearance: She is well-developed.  HENT:     Head: Normocephalic and atraumatic.  Eyes:     Conjunctiva/sclera: Conjunctivae normal.  Neck:     Musculoskeletal: Neck supple.  Cardiovascular:     Rate and Rhythm: Normal rate and regular rhythm.     Pulses: Normal pulses.  Pulmonary:     Effort: Pulmonary effort is normal. No respiratory distress.     Breath sounds: Normal breath sounds.  Abdominal:     Palpations: Abdomen is soft.     Tenderness: There is no abdominal tenderness.  Skin:    General: Skin is warm and dry.  Neurological:     Mental Status: She is alert.  Psychiatric:        Mood and Affect: Mood normal.        Behavior: Behavior normal.      ED Treatments / Results  Labs (all labs ordered are listed, but only abnormal results are displayed) Labs Reviewed  BASIC METABOLIC PANEL  CBC WITH DIFFERENTIAL/PLATELET  D-DIMER, QUANTITATIVE (NOT AT [facility])  I-STAT BETA HCG BLOOD, ED ([facility], [facility], [facility] ONLY)  TROPONIN I (HIGH SENSITIVITY)    EKG EKG Interpretation  Date/Time:  Tuesday August 15 2019 12:22:36 EST Ventricular Rate:  62 PR Interval:    QRS Duration: 68 QT Interval:  444 QTC Calculation: 451 R Axis:   62 Text Interpretation: Sinus rhythm No STEMI Confirmed by Octaviano Glow [id number]) on 08/15/2019 1:57:14 PM   Radiology Dg Chest Portable 1 View  Result Date: 08/15/2019 CLINICAL DATA:  Chest pain and shortness of breath EXAM: PORTABLE CHEST 1 VIEW COMPARISON:  April 26, 2018 FINDINGS: Lungs are clear. Heart size and pulmonary vascularity  are normal. No adenopathy. No pneumothorax. No bone lesions. IMPRESSION: No edema or consolidation.  No evident adenopathy. Electronically Signed   By: Lowella Grip III M.D.   On: 08/15/2019 14:01    Procedures Procedures (including critical care time)  Medications Ordered in ED Medications  ketorolac (TORADOL) 15 MG/ML injection 15 mg (has no administration in time range)  Initial Impression / Assessment and Plan / ED Course  I have reviewed the triage vital signs and the nursing notes.  Pertinent labs & imaging results that were available during my care of the patient were reviewed by me and considered in my medical decision making (see chart for details).  33 yo female w/ breast cancer presenting with chest pain, acute onset at work No hx of DVT or PE, no tachycardia or hypoxia, however with her breast cancer history, I will obtain a Ddimer for acute PE evaluation  Trop and ECG negative - less likely ACS, no other risk factors CXR without signs of infection or PTX  Patient vitals stable.   Will try toradol, d/c on NSAIDS, have her f/u with her PCP      Final Clinical Impressions(s) / ED Diagnoses   Final diagnoses:  Chest pain, unspecified type    ED Discharge Orders         Ordered    ibuprofen (ADVIL) 600 MG tablet  Every 6 hours PRN     08/15/19 1522           Wyvonnia Dusky, MD 08/15/19 1658

## 2019-08-15 NOTE — ED Triage Notes (Signed)
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 Pt describes centralized chest pain as shooting that intermittently radiates to back. EMS administered 324 ASA prior to arrival.   EMS Vitals: BP 145/93 HR 80 Sp02 100% RA

## 2019-08-15 NOTE — Discharge Instructions (Signed)
Your caregiver has diagnosed you as having chest pain that is not specific for one problem, but does not require admission.  Chest pain comes from many different causes.   It is very important that you speak to your provider in 1-2 days for this visit.  Your doctor may want you to be seen in the office for a follow up visit.  SEEK IMMEDIATE MEDICAL ATTENTION IF: You have severe chest pain, especially if the pain is crushing or pressure-like and spreads to the arms, back, neck, or jaw, or if you have sweating, nausea (feeling sick to your stomach), or shortness of breath. THIS IS AN EMERGENCY. Don't wait to see if the pain will go away. Get medical help at once. Call 911 or 0 (operator). DO NOT drive yourself to the hospital.   Your chest pain gets worse and does not go away with rest.  You have an attack of chest pain lasting longer than usual, despite rest and treatment with the medications your caregiver has prescribed.  You wake from sleep with chest pain or shortness of breath.  You feel dizzy or faint.  You have chest pain not typical of your usual pain for which you originally saw your caregiver.

## 2019-08-28 ENCOUNTER — Telehealth: Payer: PRIVATE HEALTH INSURANCE | Admitting: Physician Assistant

## 2019-08-28 DIAGNOSIS — Z20822 Contact with and (suspected) exposure to covid-19: Secondary | ICD-10-CM

## 2019-08-28 DIAGNOSIS — Z20828 Contact with and (suspected) exposure to other viral communicable diseases: Secondary | ICD-10-CM

## 2019-08-28 MED ORDER — BENZONATATE 100 MG PO CAPS: 100.0000 mg | ORAL_CAPSULE | Freq: Three times a day (TID) | ORAL | 0 refills | Status: AC

## 2019-08-28 MED ORDER — FLUTICASONE PROPIONATE 50 MCG/ACT NA SUSP: 2.0000 | Freq: Every day | NASAL | 0 refills | Status: AC

## 2019-08-28 MED ORDER — ALBUTEROL SULFATE HFA 108 (90 BASE) MCG/ACT IN AERS: 2.0000 | INHALATION_SPRAY | Freq: Four times a day (QID) | RESPIRATORY_TRACT | 0 refills | Status: AC | PRN

## 2019-08-28 NOTE — Progress Notes (Signed)
.E-Visit for Corona Virus Screening   Your current symptoms could be consistent with the coronavirus.  Many health care providers can now test patients at their office but not all are.  [facility] has multiple testing sites. For information on our COVID testing locations and hours go to [email or web address]  Please quarantine yourself while awaiting your test results.  We are enrolling you in our [place] for [place] . Daily you will receive a questionnaire within the [place] website. Our COVID 19 response team willl be monitoriing your responses daily. Please continue good preventive care measures, including:  frequent hand-washing, avoid touching your face, cover coughs/sneezes, stay out of crowds and keep a 6 foot distance from others.    COVID-19 is a respiratory illness with symptoms that are similar to the flu. Symptoms are typically mild to moderate, but there have been cases of severe illness and death due to the virus. The following symptoms may appear 2-14 days after exposure: . Fever . Cough . Shortness of breath or difficulty breathing . Chills . Repeated shaking with chills . Muscle pain . Headache . Sore throat . New loss of taste or smell . Fatigue . Congestion or runny nose . Nausea or vomiting . Diarrhea  If you develop fever/cough/breathlessness, please stay home for 10 days with improving symptoms and until you have had 24 hours of no fever (without taking a fever reducer).  Go to the nearest hospital ED for assessment if fever/cough/breathlessness are severe or illness seems like a threat to life.  It is vitally important that if you feel that you have an infection such as this virus or any other virus that you stay home and away from places where you may spread it to others.  You should avoid contact with people age 73 and older.   You should wear a mask or cloth face covering over your nose and mouth if you must be around other  people or animals, including pets (even at home). Try to stay at least 6 feet away from other people. This will protect the people around you.  You can use medication such as A prescription cough medication called Tessalon Perles 100 mg. You may take 1-2 capsules every 8 hours as needed for cough and A prescription inhaler called Albuterol MDI 90 mcg /actuation 2 puffs every 4 hours as needed for shortness of breath, wheezing, cough. I have also sent in a prescription for Fluticasone nasal spray for your stuffy nose.  You may also take acetaminophen (Tylenol) as needed for fever.   Reduce your risk of any infection by using the same precautions used for avoiding the common cold or flu:  [place] Kitchen Wash your hands often with soap and warm water for at least 20 seconds.  If soap and water are not readily available, use an alcohol-based hand sanitizer with at least 60% alcohol.  . If coughing or sneezing, cover your mouth and nose by coughing or sneezing into the elbow areas of your shirt or coat, into a tissue or into your sleeve (not your hands). . Avoid shaking hands with others and consider head nods or verbal greetings only. . Avoid touching your eyes, nose, or mouth with unwashed hands.  . Avoid close contact with people who are sick. . Avoid places or events with large numbers of people in one location, like concerts or sporting events. . Carefully consider travel plans you have or are making. . If you are planning any travel outside or inside  the [place], visit the CDC's Travelers' Health webpage for the latest health notices. . If you have some symptoms but not all symptoms, continue to monitor at home and seek medical attention if your symptoms worsen. . If you are having a medical emergency, call 911.  HOME CARE . Only take medications as instructed by your medical team. . Drink plenty of fluids and get plenty of rest. . A steam or ultrasonic humidifier can help if you have congestion.   GET HELP  RIGHT AWAY IF YOU HAVE EMERGENCY WARNING SIGNS** FOR COVID-19. If you or someone is showing any of these signs seek emergency medical care immediately. Call 911 or proceed to your closest emergency facility if: . You develop worsening high fever. . Trouble breathing . Bluish lips or face . Persistent pain or pressure in the chest . New confusion . Inability to wake or stay awake . You cough up blood. . Your symptoms become more severe  **This list is not all possible symptoms. Contact your medical provider for any symptoms that are sever or concerning to you.   MAKE SURE YOU   Understand these instructions.  Will watch your condition.  Will get help right away if you are not doing well or get worse.  Your e-visit answers were reviewed by a board certified advanced clinical practitioner to complete your personal care plan.  Depending on the condition, your plan could have included both over the counter or prescription medications.  If there is a problem please reply once you have received a response from your provider.  Your safety is important to [place].  If you have drug allergies check your prescription carefully.    You can use MyChart to ask questions about today's visit, request a non-urgent call back, or ask for a work or school excuse for 24 hours related to this e-Visit. If it has been greater than 24 hours you will need to follow up with your provider, or enter a new e-Visit to address those concerns. You will get an e-mail in the next two days asking about your experience.  I hope that your e-visit has been valuable and will speed your recovery. Thank you for using e-visits.   Approximately 5 minutes was spent documenting and reviewing patient's chart.

## 2019-10-07 ENCOUNTER — Emergency Department (HOSPITAL_BASED_OUTPATIENT_CLINIC_OR_DEPARTMENT_OTHER)
Admission: EM | Admit: 2019-10-07 | Discharge: 2019-10-08 | Disposition: A | Discharge status: Home / Self Care | Payer: Medicaid Other | Attending: Emergency Medicine | Admitting: Emergency Medicine

## 2019-10-07 ENCOUNTER — Emergency Department (HOSPITAL_BASED_OUTPATIENT_CLINIC_OR_DEPARTMENT_OTHER): Payer: Medicaid Other

## 2019-10-07 ENCOUNTER — Encounter (HOSPITAL_BASED_OUTPATIENT_CLINIC_OR_DEPARTMENT_OTHER): Payer: Self-pay | Admitting: Emergency Medicine

## 2019-10-07 ENCOUNTER — Other Ambulatory Visit: Payer: Self-pay

## 2019-10-07 DIAGNOSIS — C799 Secondary malignant neoplasm of unspecified site: Secondary | ICD-10-CM | POA: Diagnosis not present

## 2019-10-07 DIAGNOSIS — Z87891 Personal history of nicotine dependence: Secondary | ICD-10-CM | POA: Insufficient documentation

## 2019-10-07 DIAGNOSIS — Z853 Personal history of malignant neoplasm of breast: Secondary | ICD-10-CM | POA: Insufficient documentation

## 2019-10-07 DIAGNOSIS — R1012 Left upper quadrant pain: Secondary | ICD-10-CM | POA: Diagnosis present

## 2019-10-07 DIAGNOSIS — J90 Pleural effusion, not elsewhere classified: Secondary | ICD-10-CM | POA: Insufficient documentation

## 2019-10-07 DIAGNOSIS — N3000 Acute cystitis without hematuria: Secondary | ICD-10-CM | POA: Diagnosis not present

## 2019-10-07 DIAGNOSIS — R918 Other nonspecific abnormal finding of lung field: Secondary | ICD-10-CM | POA: Insufficient documentation

## 2019-10-07 LAB — CBC WITH DIFFERENTIAL/PLATELET
Abs Immature Granulocytes: 0.01 10*3/uL (ref 0.00–0.07)
Basophils Absolute: 0.1 10*3/uL (ref 0.0–0.1)
Basophils Relative: 1 %
Eosinophils Absolute: 0.2 10*3/uL (ref 0.0–0.5)
Eosinophils Relative: 3 %
HCT: 36.9 % (ref 36.0–46.0)
Hemoglobin: 12.2 g/dL (ref 12.0–15.0)
Immature Granulocytes: 0 %
Lymphocytes Relative: 30 %
Lymphs Abs: 2.2 10*3/uL (ref 0.7–4.0)
MCH: 26.8 pg (ref 26.0–34.0)
MCHC: 33.1 g/dL (ref 30.0–36.0)
MCV: 80.9 fL (ref 80.0–100.0)
Monocytes Absolute: 0.7 10*3/uL (ref 0.1–1.0)
Monocytes Relative: 9 %
Neutro Abs: 4.1 10*3/uL (ref 1.7–7.7)
Neutrophils Relative %: 57 %
Platelets: 301 10*3/uL (ref 150–400)
RBC: 4.56 MIL/uL (ref 3.87–5.11)
RDW: 12.4 % (ref 11.5–15.5)
WBC: 7.2 10*3/uL (ref 4.0–10.5)
nRBC: 0 % (ref 0.0–0.2)

## 2019-10-07 LAB — URINALYSIS, MICROSCOPIC (REFLEX)

## 2019-10-07 LAB — URINALYSIS, ROUTINE W REFLEX MICROSCOPIC
Bilirubin Urine: NEGATIVE
Glucose, UA: NEGATIVE mg/dL
Ketones, ur: NEGATIVE mg/dL
Leukocytes,Ua: NEGATIVE
Nitrite: NEGATIVE
Protein, ur: NEGATIVE mg/dL
Specific Gravity, Urine: >1.03 — ABNORMAL HIGH (ref 1.005–1.030)
pH: 6 (ref 5.0–8.0)

## 2019-10-07 LAB — BASIC METABOLIC PANEL
Anion gap: 9 (ref 5–15)
BUN: 10 mg/dL (ref 6–20)
CO2: 27 mmol/L (ref 22–32)
Calcium: 9 mg/dL (ref 8.9–10.3)
Chloride: 101 mmol/L (ref 98–111)
Creatinine, Ser: 0.83 mg/dL (ref 0.44–1.00)
GFR calc Af Amer: >60 mL/min (ref >60)
GFR calc non Af Amer: >60 mL/min (ref >60)
Glucose, Bld: 94 mg/dL (ref 70–99)
Potassium: 3.1 mmol/L — ABNORMAL LOW (ref 3.5–5.1)
Sodium: 137 mmol/L (ref 135–145)

## 2019-10-07 LAB — PREGNANCY, URINE: Preg Test, Ur: NEGATIVE

## 2019-10-07 MED ORDER — KETOROLAC TROMETHAMINE 30 MG/ML IJ SOLN
15.0000 mg | Freq: Once | INTRAMUSCULAR | Status: AC
  Administered 2019-10-07: 15 mg via INTRAVENOUS
  Filled 2019-10-07: qty 1

## 2019-10-07 NOTE — ED Triage Notes (Signed)
Pt here for rib pain on left side with radiates to back. States she has had pain for 4 days. Denies COVID exposure or travel. Denies other symptoms

## 2019-10-07 NOTE — ED Notes (Signed)
ED Provider at bedside. 

## 2019-10-08 ENCOUNTER — Encounter (HOSPITAL_BASED_OUTPATIENT_CLINIC_OR_DEPARTMENT_OTHER): Payer: Self-pay | Admitting: Emergency Medicine

## 2019-10-08 MED ORDER — LIDOCAINE 5 % EX PTCH: 1.0000 | MEDICATED_PATCH | CUTANEOUS | 0 refills | Status: AC

## 2019-10-08 MED ORDER — CEPHALEXIN 250 MG PO CAPS
500.0000 mg | ORAL_CAPSULE | Freq: Once | ORAL | Status: AC
  Administered 2019-10-08: 500 mg via ORAL
  Filled 2019-10-08: qty 2

## 2019-10-08 MED ORDER — NAPROXEN 500 MG PO TABS: 500.0000 mg | ORAL_TABLET | Freq: Two times a day (BID) | ORAL | 0 refills | Status: DC

## 2019-10-08 MED ORDER — NITROFURANTOIN MONOHYD MACRO 100 MG PO CAPS: 100.0000 mg | ORAL_CAPSULE | Freq: Two times a day (BID) | ORAL | 0 refills | Status: AC

## 2019-10-08 MED ORDER — IOHEXOL 300 MG/ML  SOLN
100.0000 mL | Freq: Once | INTRAMUSCULAR | Status: AC | PRN
  Administered 2019-10-08: 100 mL via INTRAVENOUS

## 2019-10-08 NOTE — ED Notes (Signed)
Patient verbalizes understanding of discharge instructions. Opportunity for questioning and answers were provided. Armband removed by staff, pt discharged from ED.  

## 2019-10-08 NOTE — ED Notes (Signed)
Patient transported to CT 

## 2019-10-08 NOTE — ED Provider Notes (Signed)
[facility] Provider Note   CSN: [id number] Arrival date & time: 10/07/19  2114     History Chief Complaint  Patient presents with  . Abdominal Pain    Mariah Wallace is a 34 y.o. female.  The history is provided by the patient.  Illness Location:  Left lateral ribs Quality:  Sharp pain Severity:  Severe Onset quality:  Gradual Duration:  5 days Timing:  Constant Progression:  Unchanged Chronicity:  New Context:  H/o breast cancer Relieved by:  Nothing Worsened by:  Nothing Ineffective treatments:  Tylenol  Associated symptoms: no abdominal pain, no congestion, no cough, no diarrhea, no ear pain, no fatigue, no fever, no headaches, no loss of consciousness, no myalgias, no nausea, no rash, no rhinorrhea, no shortness of breath, no sore throat, no vomiting and no wheezing   Risk factors:  H/o BCA Patient with h/o BCA with recent negative mammogram presents with Llateral pain in the ribs.  No f/c/r.  No leg pain or swelling.       Past Medical History:  Diagnosis Date  . Breast cancer ([place]) 2018  . Medical history non-contributory   . Urinary tract infection     Patient Active Problem List   Diagnosis Date Noted  . Malignant neoplasm of upper-outer quadrant of right breast in female, estrogen receptor negative ([place]) 03/15/2018  . Lumbar paraspinal muscle spasm 10/02/2015  . Active labor 10/18/2013  . 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 PUPPP (pruritic urticarial papules and plaques of pregnancy) 09/07/2013  . Sickle cell trait ([place]) 05/17/2013  . First trimester bleeding 05/17/2013  . Supervision of normal pregnancy in second trimester 05/17/2013  . GBS (group B streptococcus) UTI complicating pregnancy 0000000    Past Surgical History:  Procedure Laterality Date  . BREAST LUMPECTOMY Right 09/16/2017  . DILATION AND CURETTAGE OF UTERUS    . MASTECTOMY    . PLACEMENT OF BREAST IMPLANTS       OB History     Gravida  3   Para  1   Term  1   Preterm  0   AB  2   Living  1     SAB  0   TAB  2   Ectopic  0   Multiple  0   Live Births  1           Family History  Problem Relation Age of Onset  . Hypertension Mother   . Cancer Paternal Uncle     Social History   Tobacco Use  . Smoking status: Former Smoker    Quit date: 03/16/2013    Years since quitting: 6.5  . Smokeless tobacco: Never Used  Substance Use Topics  . Alcohol use: Yes    Comment: occ  . Drug use: Yes    Types: Marijuana    Home Medications Prior to Admission medications   Medication Sig Start Date End Date Taking? Authorizing Provider  albuterol (VENTOLIN HFA) 108 (90 Base) MCG/ACT inhaler Inhale 2 puffs into the lungs every 6 (six) hours as needed for wheezing or shortness of breath. 08/28/19   Couture, Cortni S, PA-C  cephALEXin (KEFLEX) 500 MG capsule Take 1 capsule (500 mg total) by mouth 4 (four) times daily. 08/15/18   Horton, Barbette Hair, MD  dronabinol (MARINOL) 5 MG capsule Take by mouth. 02/25/18   [provider]  fluticasone (FLONASE) 50 MCG/ACT nasal spray Place 2 sprays into both nostrils daily. 08/28/19  Couture, Cortni S, PA-C  ibuprofen (ADVIL) 600 MG tablet Take 1 tablet (600 mg total) by mouth every 6 (six) hours as needed for up to 30 doses for mild pain or moderate pain. 08/15/19   Wyvonnia Dusky, MD  ibuprofen (ADVIL,MOTRIN) 600 MG tablet Take 1 tablet (600 mg total) by mouth every 6 (six) hours as needed. 08/15/18   Horton, Barbette Hair, MD  lidocaine (LIDODERM) 5 % Place 1 patch onto the skin daily. Remove & Discard patch within 12 hours or as directed by MD 10/08/19   Randal Buba, Veatrice Eckstein, MD  naproxen (NAPROSYN) 500 MG tablet Take 1 tablet (500 mg total) by mouth 2 (two) times daily. 10/08/19   Vauda Salvucci, MD  ondansetron (ZOFRAN ODT) 4 MG disintegrating tablet 4mg  ODT q4 hours prn nausea/vomit 11/18/18   Jacqlyn Larsen, PA-C  oseltamivir (TAMIFLU) 75 MG capsule Take 1  capsule (75 mg total) by mouth every 12 (twelve) hours. 11/18/18   Jacqlyn Larsen, PA-C    Allergies    Patient has no known allergies.  Review of Systems   Review of Systems  Constitutional: Negative for fatigue and fever.  HENT: Negative for congestion, ear pain, rhinorrhea and sore throat.   Eyes: Negative for visual disturbance.  Respiratory: Negative for cough, shortness of breath and wheezing.   Gastrointestinal: Negative for abdominal pain, diarrhea, nausea and vomiting.  Genitourinary: Negative for difficulty urinating.  Musculoskeletal: Negative for myalgias.  Skin: Negative for rash.  Neurological: Negative for loss of consciousness and headaches.  Psychiatric/Behavioral: Negative for agitation.  All other systems reviewed and are negative.   Physical Exam Updated Vital Signs BP (!) 122/103 (BP Location: Left Arm)   Pulse 71   Temp 98.1 F (36.7 C)   Resp 19   Ht 5\' 1"  (1.549 m)   Wt 46.7 kg   LMP 10/02/2019   SpO2 100%   BMI 19.46 kg/m   Physical Exam Vitals and nursing note reviewed.  Constitutional:      General: She is not in acute distress.    Appearance: Normal appearance.  HENT:     Head: Normocephalic and atraumatic.     Nose: Nose normal.  Eyes:     Conjunctiva/sclera: Conjunctivae normal.     Pupils: Pupils are equal, round, and reactive to light.  Cardiovascular:     Rate and Rhythm: Normal rate and regular rhythm.     Pulses: Normal pulses.     Heart sounds: Normal heart sounds.  Pulmonary:     Effort: Pulmonary effort is normal.     Breath sounds: Examination of the left-lower field reveals decreased breath sounds. Decreased breath sounds present.  Abdominal:     General: Abdomen is flat. Bowel sounds are normal.     Tenderness: There is no abdominal tenderness. There is no guarding or rebound.  Musculoskeletal:        General: Normal range of motion.     Cervical back: Normal range of motion and neck supple.  Skin:    General: Skin is  warm and dry.     Capillary Refill: Capillary refill takes less than 2 seconds.  Neurological:     General: No focal deficit present.     Mental Status: She is alert and oriented to person, place, and time.     Deep Tendon Reflexes: Reflexes normal.  Psychiatric:        Mood and Affect: Mood normal.        Behavior: Behavior normal.  ED Results / Procedures / Treatments   Labs (all labs ordered are listed, but only abnormal results are displayed) Results for orders placed or performed during the hospital encounter of 10/07/19  Pregnancy, urine  Result Value Ref Range   Preg Test, Ur NEGATIVE NEGATIVE  Urinalysis, Routine w reflex microscopic  Result Value Ref Range   Color, Urine YELLOW YELLOW   APPearance CLEAR CLEAR   Specific Gravity, Urine >1.030 (H) 1.005 - 1.030   pH 6.0 5.0 - 8.0   Glucose, UA NEGATIVE NEGATIVE mg/dL   Hgb urine dipstick SMALL (A) NEGATIVE   Bilirubin Urine NEGATIVE NEGATIVE   Ketones, ur NEGATIVE NEGATIVE mg/dL   Protein, ur NEGATIVE NEGATIVE mg/dL   Nitrite NEGATIVE NEGATIVE   Leukocytes,Ua NEGATIVE NEGATIVE  CBC with Differential  Result Value Ref Range   WBC 7.2 4.0 - 10.5 K/uL   RBC 4.56 3.87 - 5.11 MIL/uL   Hemoglobin 12.2 12.0 - 15.0 g/dL   HCT 36.9 36.0 - 46.0 %   MCV 80.9 80.0 - 100.0 fL   MCH 26.8 26.0 - 34.0 pg   MCHC 33.1 30.0 - 36.0 g/dL   RDW 12.4 11.5 - 15.5 %   Platelets 301 150 - 400 K/uL   nRBC 0.0 0.0 - 0.2 %   Neutrophils Relative % 57 %   Neutro Abs 4.1 1.7 - 7.7 K/uL   Lymphocytes Relative 30 %   Lymphs Abs 2.2 0.7 - 4.0 K/uL   Monocytes Relative 9 %   Monocytes Absolute 0.7 0.1 - 1.0 K/uL   Eosinophils Relative 3 %   Eosinophils Absolute 0.2 0.0 - 0.5 K/uL   Basophils Relative 1 %   Basophils Absolute 0.1 0.0 - 0.1 K/uL   Immature Granulocytes 0 %   Abs Immature Granulocytes 0.01 0.00 - 0.07 K/uL  Basic metabolic panel  Result Value Ref Range   Sodium 137 135 - 145 mmol/L   Potassium 3.1 (L) 3.5 - 5.1 mmol/L     Chloride 101 98 - 111 mmol/L   CO2 27 22 - 32 mmol/L   Glucose, Bld 94 70 - 99 mg/dL   BUN 10 6 - 20 mg/dL   Creatinine, Ser 0.83 0.44 - 1.00 mg/dL   Calcium 9.0 8.9 - 10.3 mg/dL   GFR calc non Af Amer >60 >60 mL/min   GFR calc Af Amer >60 >60 mL/min   Anion gap 9 5 - 15  Urinalysis, Microscopic (reflex)  Result Value Ref Range   RBC / HPF 0-5 0 - 5 RBC/hpf   WBC, UA 11-20 0 - 5 WBC/hpf   Bacteria, UA MANY (A) NONE SEEN   Squamous Epithelial / LPF 0-5 0 - 5   Mucus PRESENT    CT Angio Chest PE W and/or Wo Contrast  Result Date: 10/08/2019 CLINICAL DATA:  Left rib pain, history of breast cancer EXAM: CT ANGIOGRAPHY CHEST WITH CONTRAST TECHNIQUE: Multidetector CT imaging of the chest was performed using the standard protocol during bolus administration of intravenous contrast. Multiplanar CT image reconstructions and MIPs were obtained to evaluate the vascular anatomy. CONTRAST:  181mL OMNIPAQUE IOHEXOL 300 MG/ML  SOLN COMPARISON:  None. FINDINGS: Cardiovascular: There is a optimal opacification of the pulmonary arteries. There is no central,segmental, or subsegmental filling defects within the pulmonary arteries. The heart is normal in size. No pericardial effusion or thickening. No evidence right heart strain. There is normal three-vessel brachiocephalic anatomy without proximal stenosis. The thoracic aorta is normal in appearance. Mediastinum/Nodes: No hilar,  mediastinal, or axillary adenopathy. Thyroid gland, trachea, and esophagus demonstrate no significant findings. Lungs/Pleura: There are new scattered subcentimeter pulmonary nodules seen throughout the right lung. For example within the right upper lung apex a 8 mm pulmonary nodule seen on series 6, image 22 and within the right lung base measuring 7 mm seen on series 6, image 52. there is a moderate left pleural effusion with adjacent basilar compressive atelectasis. Upper Abdomen: No acute abnormalities present in the visualized  portions of the upper abdomen. Musculoskeletal: There is subcutaneous edema with soft tissue thickening seen overlying the left sub pectoralis breast implant. Review of the MIP images confirms the above findings. IMPRESSION: 1.  No central, segmental, or subsegmental pulmonary embolism. 2. Scattered subcentimeter pulmonary nodules throughout the right lung as described above, concerning for metastatic disease. 3. Moderate left pleural effusion with adjacent basilar atelectasis 4. Subcutaneous soft tissue thickening seen overlying the left breast implant. Electronically Signed   By: Prudencio Pair M.D.   On: 10/08/2019 01:51    Radiology CT Angio Chest PE W and/or Wo Contrast  Result Date: 10/08/2019 CLINICAL DATA:  Left rib pain, history of breast cancer EXAM: CT ANGIOGRAPHY CHEST WITH CONTRAST TECHNIQUE: Multidetector CT imaging of the chest was performed using the standard protocol during bolus administration of intravenous contrast. Multiplanar CT image reconstructions and MIPs were obtained to evaluate the vascular anatomy. CONTRAST:  158mL OMNIPAQUE IOHEXOL 300 MG/ML  SOLN COMPARISON:  None. FINDINGS: Cardiovascular: There is a optimal opacification of the pulmonary arteries. There is no central,segmental, or subsegmental filling defects within the pulmonary arteries. The heart is normal in size. No pericardial effusion or thickening. No evidence right heart strain. There is normal three-vessel brachiocephalic anatomy without proximal stenosis. The thoracic aorta is normal in appearance. Mediastinum/Nodes: No hilar, mediastinal, or axillary adenopathy. Thyroid gland, trachea, and esophagus demonstrate no significant findings. Lungs/Pleura: There are new scattered subcentimeter pulmonary nodules seen throughout the right lung. For example within the right upper lung apex a 8 mm pulmonary nodule seen on series 6, image 22 and within the right lung base measuring 7 mm seen on series 6, image 52. there is a  moderate left pleural effusion with adjacent basilar compressive atelectasis. Upper Abdomen: No acute abnormalities present in the visualized portions of the upper abdomen. Musculoskeletal: There is subcutaneous edema with soft tissue thickening seen overlying the left sub pectoralis breast implant. Review of the MIP images confirms the above findings. IMPRESSION: 1.  No central, segmental, or subsegmental pulmonary embolism. 2. Scattered subcentimeter pulmonary nodules throughout the right lung as described above, concerning for metastatic disease. 3. Moderate left pleural effusion with adjacent basilar atelectasis 4. Subcutaneous soft tissue thickening seen overlying the left breast implant. Electronically Signed   By: Prudencio Pair M.D.   On: 10/08/2019 01:51    Procedures Procedures (including critical care time)  Medications Ordered in ED Medications  ketorolac (TORADOL) 30 MG/ML injection 15 mg (15 mg Intravenous Given 10/07/19 2342)  iohexol (OMNIPAQUE) 300 MG/ML solution 100 mL (100 mLs Intravenous Contrast Given 10/08/19 0123)  cephALEXin (KEFLEX) capsule 500 mg (500 mg Oral Given 10/08/19 [id number])    ED Course  I have reviewed the triage vital signs and the nursing notes.  Pertinent labs & imaging results that were available during my care of the patient were reviewed by me and considered in my medical decision making (see chart for details).    223 Case d/w Dr. Benay Spice epic inbox sent to get patient outpatient oncology follow  up.     Patient informed of all CT results.  Patient stated she had cancer in the right breast in the past but was told her last R mammogram was normal.  I explained I have referred her to oncology at [facility].  She has asked for a note for work.  I have provided this.  All questions answered to the patient's satisfaction.   Mariah Wallace was evaluated in Emergency Department on 10/08/2019 for the symptoms described in the history of present illness. She was evaluated  in the context of the global COVID-19 pandemic, which necessitated consideration that the patient might be at risk for infection with the SARS-CoV-2 virus that causes COVID-19. Institutional protocols and algorithms that pertain to the evaluation of patients at risk for COVID-19 are in a state of rapid change based on information released by regulatory bodies including the CDC and federal and state organizations. These policies and algorithms were followed during the patient's care in the ED.  Final Clinical Impression(s) / ED Diagnoses Final diagnoses:  Pleural effusion  Pulmonary nodules/lesions, multiple  Metastatic malignant neoplasm, unspecified site [facility])   Return for intractable cough, coughing up blood,fevers >100.4 unrelieved by medication, shortness of breath, intractable vomiting, chest pain, shortness of breath, weakness,numbness, changes in speech, facial asymmetry,abdominal pain, passing out,Inability to tolerate liquids or food, cough, altered mental status or any concerns. No signs of systemic illness or infection. The patient is nontoxic-appearing on exam and vital signs are within normal limits.   I have reviewed the triage vital signs and the nursing notes. Pertinent labs &imaging results that were available during my care of the patient were reviewed by me and considered in my medical decision making (see chart for details).  After history, exam, and medical workup I feel the patient has been appropriately medically screened and is safe for discharge home. Pertinent diagnoses were discussed with the patient. Patient was given return Rx / DC Orders ED Discharge Orders         Ordered    lidocaine (LIDODERM) 5 %  Every 24 hours     10/08/19 0226    naproxen (NAPROSYN) 500 MG tablet  2 times daily     10/08/19 0226           Constantine Ruddick, MD 10/08/19 [id number]

## 2019-10-09 ENCOUNTER — Telehealth: Payer: Self-pay | Admitting: Hematology

## 2019-10-09 NOTE — Telephone Encounter (Signed)
A new pt sppt has been scheduled for the pt to see Lscie Burton/Dr. Burr Medico on 1/14 at 1:45pm. Pt is aware to arrive 15 minutes early.

## 2019-10-12 ENCOUNTER — Telehealth: Payer: Self-pay

## 2019-10-12 ENCOUNTER — Inpatient Hospital Stay: Payer: Self-pay | Attending: Nurse Practitioner | Admitting: Nurse Practitioner

## 2019-10-12 NOTE — Telephone Encounter (Signed)
Left voicemail for patient to call back [facility] in regard to missed appointment today with Cira Rue NP @ 1:45p

## 2019-11-10 ENCOUNTER — Telehealth: Payer: Self-pay | Admitting: Internal Medicine

## 2019-11-10 NOTE — Telephone Encounter (Signed)
Scheduled [facility] Palliative appt for 11-13-19 at 2:00.

## 2019-11-13 ENCOUNTER — Other Ambulatory Visit: Payer: Self-pay

## 2019-11-13 ENCOUNTER — Other Ambulatory Visit: Payer: Self-pay | Admitting: Internal Medicine

## 2019-12-29 ENCOUNTER — Other Ambulatory Visit: Payer: Medicaid Other | Admitting: Internal Medicine

## 2019-12-29 ENCOUNTER — Other Ambulatory Visit: Payer: Self-pay

## 2020-02-01 ENCOUNTER — Other Ambulatory Visit: Payer: Self-pay

## 2020-02-01 ENCOUNTER — Other Ambulatory Visit: Payer: Medicaid Other | Admitting: Internal Medicine

## 2020-02-01 DIAGNOSIS — Z515 Encounter for palliative care: Secondary | ICD-10-CM

## 2020-02-01 NOTE — Progress Notes (Signed)
[facility] Consult Note Telephone: [phone]  Fax: [phone]  PATIENT NAME: Mariah Wallace DOB: 1985-12-17 MRN: [id number]  PRIMARY CARE PROVIDER:   System, Pcp Not In  REFERRING PROVIDER: Dr. Lemont Fillers  RESPONSIBLE PARTY:   Self       RECOMMENDATIONS and PLAN:  Palliative care encounter Z51.5  1. Advance care planning:  Health care treatment plan remains as full scope of treatment with antibiotics, IV fluids and feeding tube placement. MOST form is complete and in patient's home.  Palliative care will followup with patient in aprox 8 weeks.  2.  Metastatic breast cancer:  Desires to restart Chemotherapy that has been temporarily suspended.  Phone nurse navigator to determine plans for future treatments.  She understands that she may have to establish with a different oncology practice related to recent emotional outburst. Continue cancer treatment plan for hopeful advancement of disease process.   3.  Cancer related pain:  Resolved.  Monitor in the future.  I spent 30 minutes providing this consultation,  from 0915 to 0945. More than 50% of the time in this consultation was spent coordinating communication with patient.   HISTORY OF PRESENT ILLNESS:  Mariah Wallace is a 34 y.o. year old female with multiple medical problems including metastatic breast cancer.  She has been receiving chemotherapy treatments.  She denies pain or shortness of breath or pain.  Energy has improved. Palliative Care was asked to help address goals of care.   CODE STATUS: FULL CODE  PPS: 70% HOSPICE ELIGIBILITY/DIAGNOSIS: TBD  PAST MEDICAL HISTORY:  Past Medical History:  Diagnosis Date  . Breast cancer ([place]) 2018  . Medical history non-contributory   . Urinary tract infection      ALLERGIES: No Known Allergies    PERTINENT MEDICATIONS:  Outpatient Encounter Medications as of 02/01/2020  Medication Sig  . albuterol (VENTOLIN HFA)  108 (90 Base) MCG/ACT inhaler Inhale 2 puffs into the lungs every 6 (six) hours as needed for wheezing or shortness of breath.  . cephALEXin (KEFLEX) 500 MG capsule Take 1 capsule (500 mg total) by mouth 4 (four) times daily.  Marland Kitchen dronabinol (MARINOL) 5 MG capsule Take by mouth.  . fluticasone (FLONASE) 50 MCG/ACT nasal spray Place 2 sprays into both nostrils daily.  Marland Kitchen ibuprofen (ADVIL) 600 MG tablet Take 1 tablet (600 mg total) by mouth every 6 (six) hours as needed for up to 30 doses for mild pain or moderate pain.  Marland Kitchen ibuprofen (ADVIL,MOTRIN) 600 MG tablet Take 1 tablet (600 mg total) by mouth every 6 (six) hours as needed.  . lidocaine (LIDODERM) 5 % Place 1 patch onto the skin daily. Remove & Discard patch within 12 hours or as directed by MD  . naproxen (NAPROSYN) 500 MG tablet Take 1 tablet (500 mg total) by mouth 2 (two) times daily.  . nitrofurantoin, macrocrystal-monohydrate, (MACROBID) 100 MG capsule Take 1 capsule (100 mg total) by mouth 2 (two) times daily.  . ondansetron (ZOFRAN ODT) 4 MG disintegrating tablet 4mg  ODT q4 hours prn nausea/vomit  . oseltamivir (TAMIFLU) 75 MG capsule Take 1 capsule (75 mg total) by mouth every 12 (twelve) hours.   No facility-administered encounter medications on file as of 02/01/2020.    PHYSICAL EXAM:   General: NAD,  thin Cardiovascular: regular rate and rhythm Pulmonary: clear all fields Abdomen: soft, nontender, + bowel sounds Extremities: no edema, no joint deformities Skin: no rashes Neurological: Weakness but otherwise nonfocal Psych:  In  good spirits.  Calm and cooperative  Gonzella Lex, NP

## 2020-03-02 IMAGING — CT CT ANGIO CHEST
2 of 9 series · 18 of 36 positions shown · IV contrast (Omnipaque)
Comparison: None.

CLINICAL DATA: Left rib pain, history of breast cancer

EXAM:
CT ANGIOGRAPHY CHEST WITH CONTRAST
TECHNIQUE: Multidetector CT imaging of the chest was performed using the
standard protocol during bolus administration of intravenous
contrast. Multiplanar CT image reconstructions and MIPs were
obtained to evaluate the vascular anatomy.
CONTRAST:  100mL OMNIPAQUE IOHEXOL 300 MG/ML  SOLN

[Series 7: pe coronal mpr · coronal · 0.52mm/px · 1 of 97 slices shown]
[im 49/97  mediastinal]
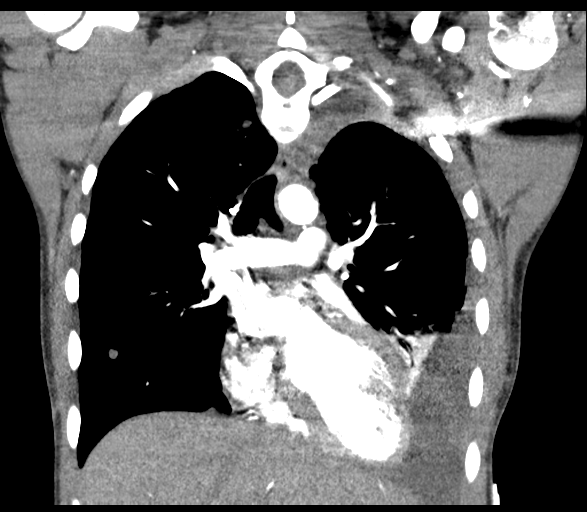

[Series 11: pe thins · axial · 0.58mm/px · z∈[-246,-16]mm · 17 of 258 slices shown]
[im 14/258  lung]
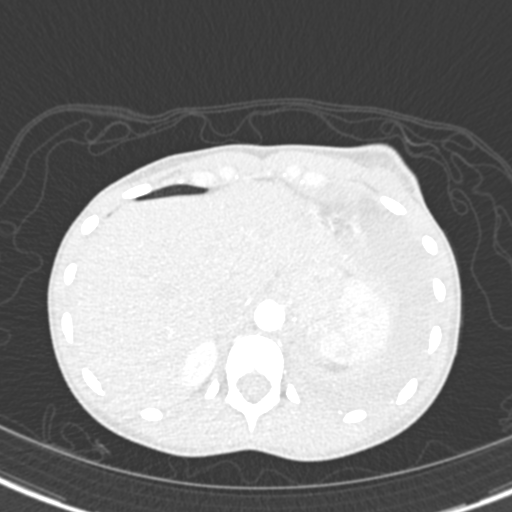
[im 28/258  mediastinal]
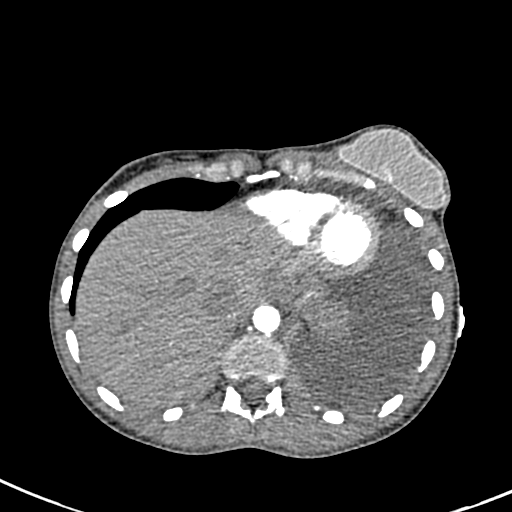
[im 41/258  lung]
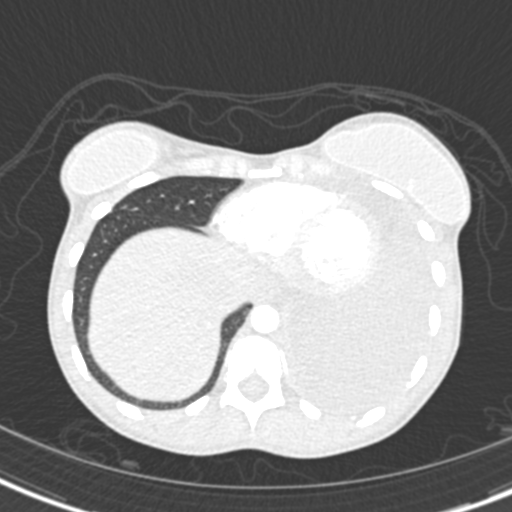
[im 55/258  mediastinal]
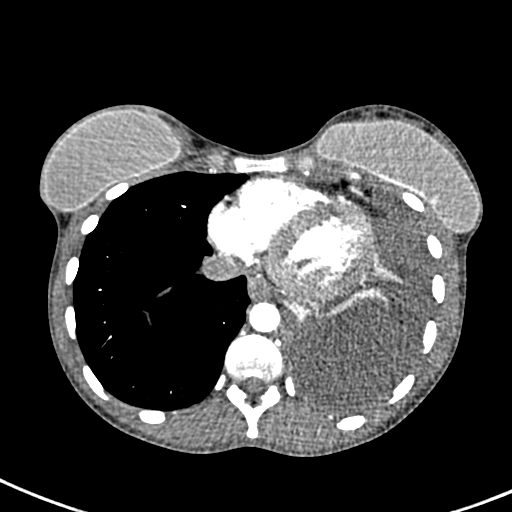
[im 68/258  lung]
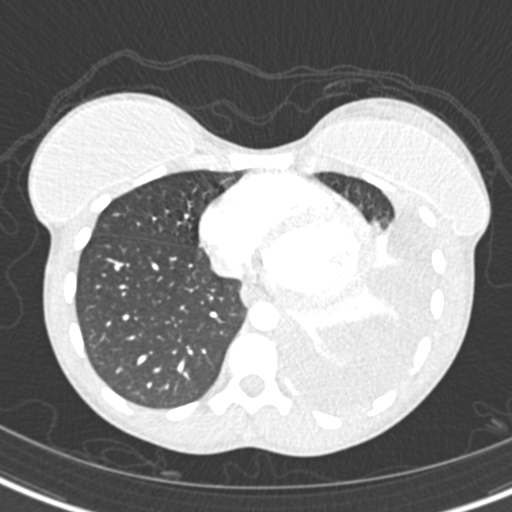
[im 82/258  mediastinal]
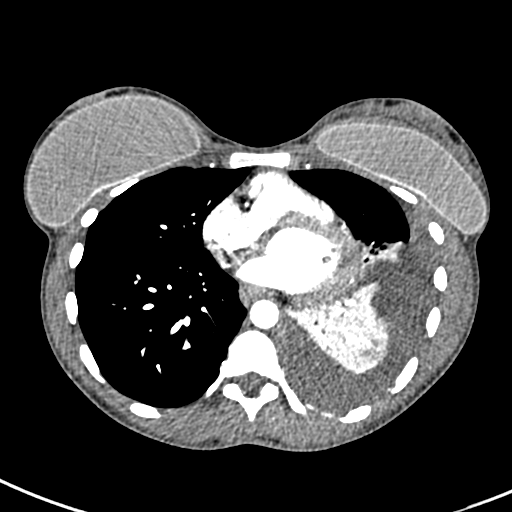
[im 95/258  lung]
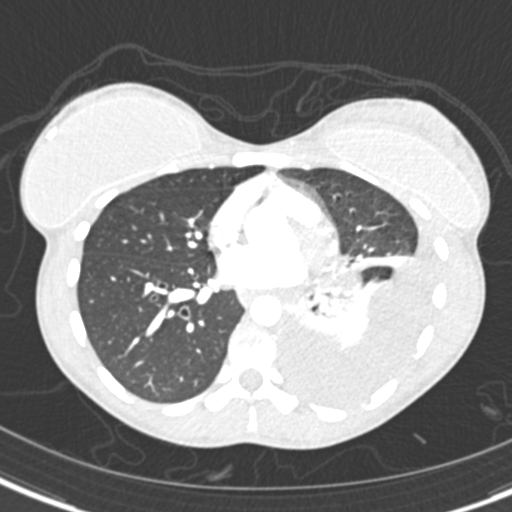
[im 109/258  mediastinal]
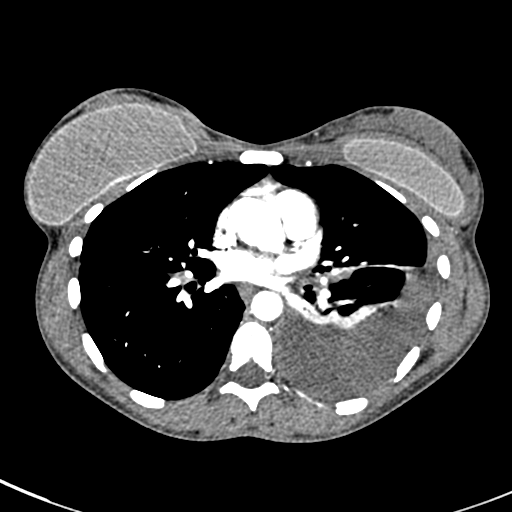
[im 136/258  lung]
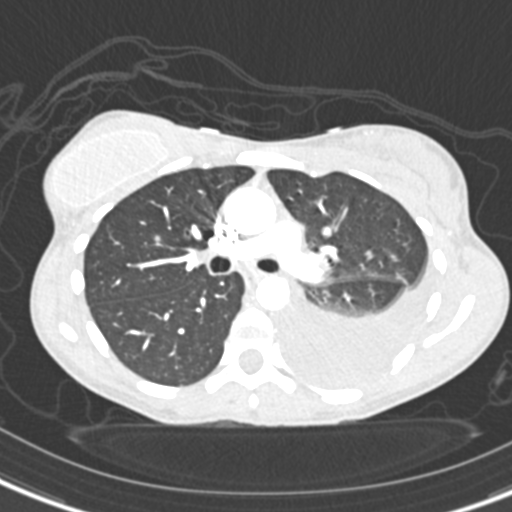
[im 149/258  mediastinal]
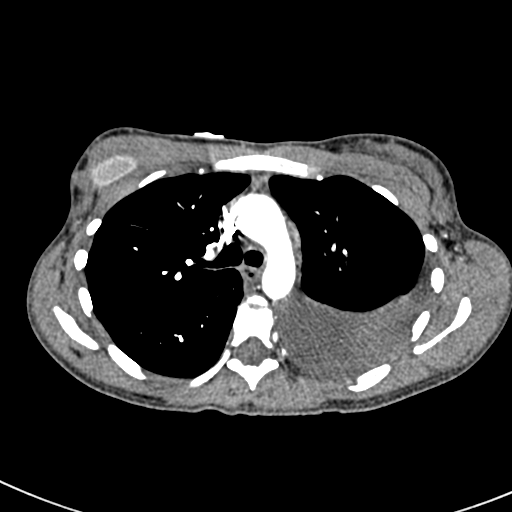
[im 163/258  lung]
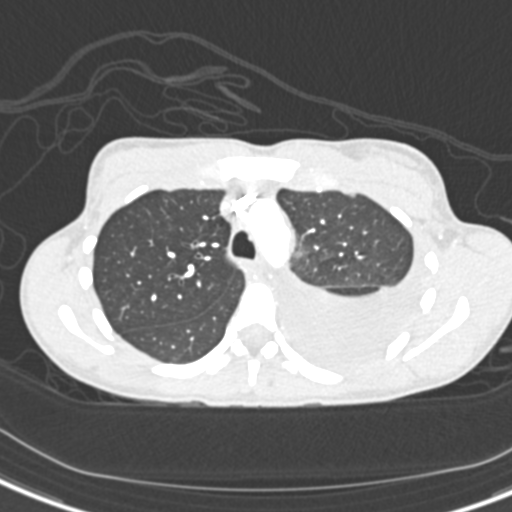
[im 176/258  mediastinal]
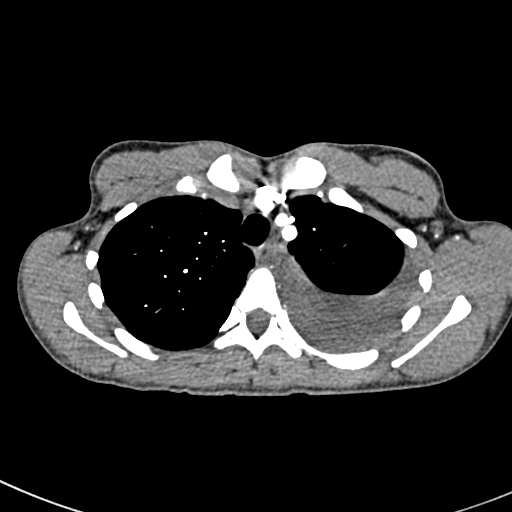
[im 190/258  lung]
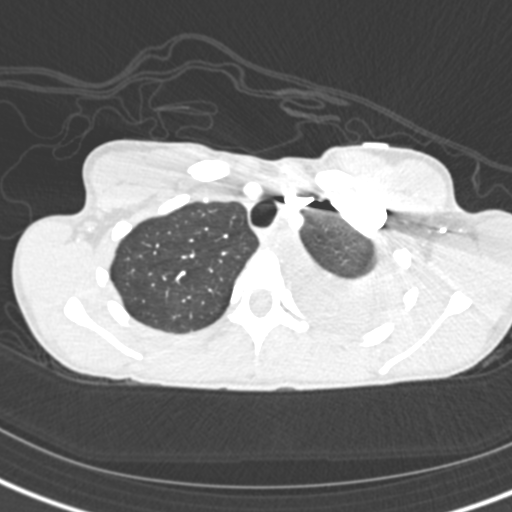
[im 203/258  mediastinal]
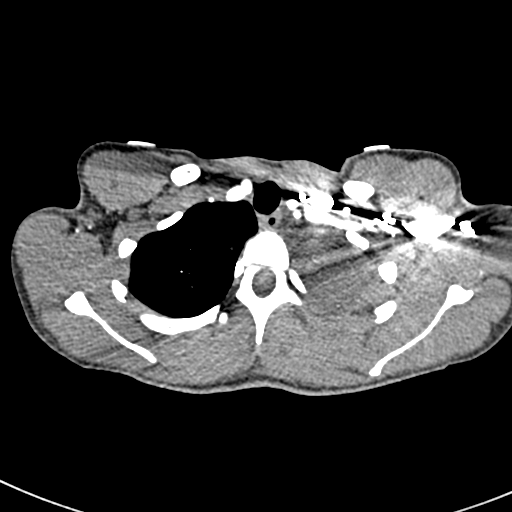
[im 217/258  lung]
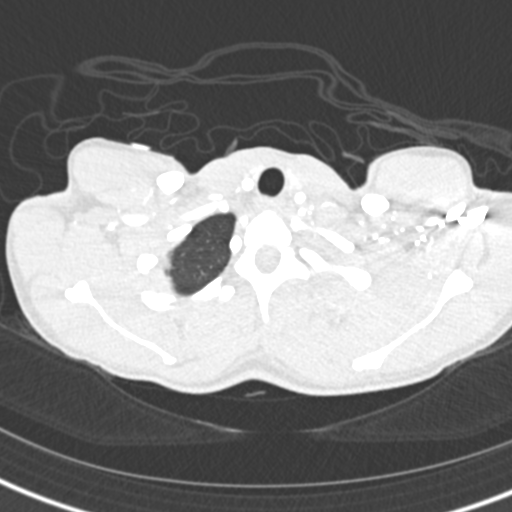
[im 230/258  mediastinal]
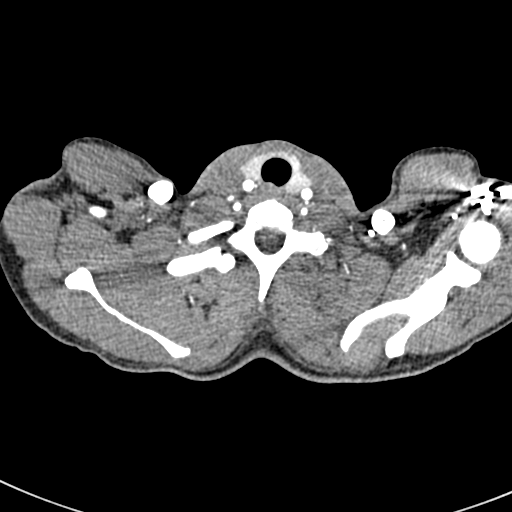
[im 244/258  lung]
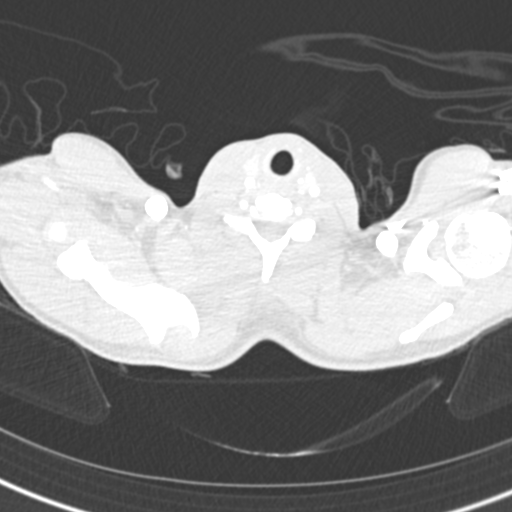

[18 of 36 positions shown; findings below may reference images not displayed]

FINDINGS: Cardiovascular: There is a optimal opacification of the pulmonary
arteries. There is no central,segmental, or subsegmental filling
defects within the pulmonary arteries. The heart is normal in size.
No pericardial effusion or thickening. No evidence right heart
strain. There is normal three-vessel brachiocephalic anatomy without
proximal stenosis. The thoracic aorta is normal in appearance.

Mediastinum/Nodes: No hilar, mediastinal, or axillary adenopathy.
Thyroid gland, trachea, and esophagus demonstrate no significant
findings.

Lungs/Pleura: There are new scattered subcentimeter pulmonary
nodules seen throughout the right lung. For example within the right
upper lung apex a 8 mm pulmonary nodule seen on series 6, image 22
and within the right lung base measuring 7 mm seen on series 6,
image 52. there is a moderate left pleural effusion with adjacent
basilar compressive atelectasis.

Upper Abdomen: No acute abnormalities present in the visualized
portions of the upper abdomen.

Musculoskeletal: There is subcutaneous edema with soft tissue
thickening seen overlying the left sub pectoralis breast implant.

Review of the MIP images confirms the above findings.
IMPRESSION: 1.  No central, segmental, or subsegmental pulmonary embolism.
2. Scattered subcentimeter pulmonary nodules throughout the right
lung as described above, concerning for metastatic disease.
3. Moderate left pleural effusion with adjacent basilar atelectasis
4. Subcutaneous soft tissue thickening seen overlying the left
breast implant.

## 2020-04-04 ENCOUNTER — Other Ambulatory Visit: Payer: Medicaid Other | Admitting: Internal Medicine

## 2020-04-14 NOTE — Progress Notes (Signed)
    [facility] Consult Note Telephone: [phone]  Fax: [phone]  PATIENT NAME: Mariah Wallace DOB: 1986/06/08 MRN: [id number]  REFERRING PROVIDER:   Dr. Lemont Fillers  RESPONSIBLE PARTY:   Self  NOTE:  Arrived at patient's apartment for a prescheduled palliative care follow-up appointment.  No response at door and voicemail received on listed number.  This provider left a voicemail message for patient to return this call for updates and/or to reschedules this visit.    Gonzella Lex, NP=C

## 2020-04-15 ENCOUNTER — Encounter (HOSPITAL_COMMUNITY): Payer: Self-pay

## 2020-04-15 ENCOUNTER — Emergency Department (HOSPITAL_COMMUNITY)
Admission: EM | Admit: 2020-04-15 | Discharge: 2020-04-16 | Disposition: A | Discharge status: Home / Self Care | Payer: Medicaid Other | Attending: Emergency Medicine | Admitting: Emergency Medicine

## 2020-04-15 ENCOUNTER — Other Ambulatory Visit: Payer: Self-pay

## 2020-04-15 DIAGNOSIS — G893 Neoplasm related pain (acute) (chronic): Secondary | ICD-10-CM | POA: Insufficient documentation

## 2020-04-15 DIAGNOSIS — R079 Chest pain, unspecified: Secondary | ICD-10-CM | POA: Diagnosis present

## 2020-04-15 DIAGNOSIS — C50411 Malignant neoplasm of upper-outer quadrant of right female breast: Secondary | ICD-10-CM | POA: Diagnosis not present

## 2020-04-15 DIAGNOSIS — Z87891 Personal history of nicotine dependence: Secondary | ICD-10-CM | POA: Insufficient documentation

## 2020-04-15 DIAGNOSIS — Z79899 Other long term (current) drug therapy: Secondary | ICD-10-CM | POA: Diagnosis not present

## 2020-04-15 MED ORDER — SODIUM CHLORIDE 0.9% FLUSH: 3.0000 mL | Freq: Once | INTRAVENOUS | Status: DC

## 2020-04-15 NOTE — ED Triage Notes (Signed)
Pt reports that she has been having CP, SOB, back pain for the past 4 days, pt has stage 4 breast cancer, actively receiving chemo. Denies fevers.

## 2020-04-15 NOTE — ED Notes (Signed)
Pt called for triage x2 with no response.

## 2020-04-16 ENCOUNTER — Emergency Department (HOSPITAL_COMMUNITY): Payer: Medicaid Other

## 2020-04-16 LAB — I-STAT BETA HCG BLOOD, ED (MC, WL, AP ONLY): I-stat hCG, quantitative: <5 m[IU]/mL (ref <5)

## 2020-04-16 LAB — CBC
HCT: 32.3 % — ABNORMAL LOW (ref 36.0–46.0)
Hemoglobin: 10.7 g/dL — ABNORMAL LOW (ref 12.0–15.0)
MCH: 29.4 pg (ref 26.0–34.0)
MCHC: 33.1 g/dL (ref 30.0–36.0)
MCV: 88.7 fL (ref 80.0–100.0)
Platelets: 162 10*3/uL (ref 150–400)
RBC: 3.64 MIL/uL — ABNORMAL LOW (ref 3.87–5.11)
RDW: 13.6 % (ref 11.5–15.5)
WBC: 7.2 10*3/uL (ref 4.0–10.5)
nRBC: 0 % (ref 0.0–0.2)

## 2020-04-16 LAB — BASIC METABOLIC PANEL
Anion gap: 10 (ref 5–15)
BUN: 10 mg/dL (ref 6–20)
CO2: 26 mmol/L (ref 22–32)
Calcium: 9.6 mg/dL (ref 8.9–10.3)
Chloride: 101 mmol/L (ref 98–111)
Creatinine, Ser: 0.74 mg/dL (ref 0.44–1.00)
GFR calc Af Amer: >60 mL/min (ref >60)
GFR calc non Af Amer: >60 mL/min (ref >60)
Glucose, Bld: 110 mg/dL — ABNORMAL HIGH (ref 70–99)
Potassium: 3.6 mmol/L (ref 3.5–5.1)
Sodium: 137 mmol/L (ref 135–145)

## 2020-04-16 LAB — TROPONIN I (HIGH SENSITIVITY)
Troponin I (High Sensitivity): 5 ng/L (ref <18)
Troponin I (High Sensitivity): 4 ng/L (ref <18)

## 2020-04-16 MED ORDER — KETOROLAC TROMETHAMINE 15 MG/ML IJ SOLN
15.0000 mg | Freq: Once | INTRAMUSCULAR | Status: AC
  Administered 2020-04-16: 15 mg via INTRAVENOUS
  Filled 2020-04-16: qty 1

## 2020-04-16 MED ORDER — NAPROXEN 375 MG PO TABS
375.0000 mg | ORAL_TABLET | Freq: Two times a day (BID) | ORAL | 0 refills | Status: AC
Start: 2020-04-16 — End: ?

## 2020-04-16 MED ORDER — DIPHENHYDRAMINE HCL 50 MG/ML IJ SOLN
25.0000 mg | Freq: Once | INTRAMUSCULAR | Status: AC
  Administered 2020-04-16: 25 mg via INTRAVENOUS
  Filled 2020-04-16: qty 1

## 2020-04-16 MED ORDER — HEPARIN SOD (PORK) LOCK FLUSH 100 UNIT/ML IV SOLN
500.0000 [IU] | Freq: Once | INTRAVENOUS | Status: AC
  Administered 2020-04-16: 500 [IU]
  Filled 2020-04-16: qty 5

## 2020-04-16 MED ORDER — PROCHLORPERAZINE EDISYLATE 10 MG/2ML IJ SOLN
10.0000 mg | Freq: Once | INTRAMUSCULAR | Status: AC
  Administered 2020-04-16: 10 mg via INTRAVENOUS
  Filled 2020-04-16: qty 2

## 2020-04-16 MED ORDER — HYDROMORPHONE HCL 1 MG/ML IJ SOLN
1.0000 mg | Freq: Once | INTRAMUSCULAR | Status: AC
  Administered 2020-04-16: 1 mg via INTRAVENOUS
  Filled 2020-04-16: qty 1

## 2020-04-16 NOTE — ED Provider Notes (Addendum)
[facility] EMERGENCY DEPARTMENT Provider Note   CSN: [id number] Arrival date & time: 04/15/20  2212     History Chief Complaint  Patient presents with  . Chest Pain    Mariah Wallace is a 34 y.o. female.  HPI     34 year old female comes in a chief complaint of chest pain. Patient has history of advanced breast cancer.  She reports that she has been having some midsternal chest pain and diffuse thoracic back pain.  The symptoms are similar to her cancer pain.  He was taking heavy dose pain medication but weaned herself off of them because of the side effects.  The pain was under control until over the last 2 to 3 weeks when it has returned.  She is also having some headaches.  She was seen at a hospital in [place] where she had a CT scan of her brain which was negative.  She informed us that she has been getting chemo infusion at [facility] but has not had an appointment with an oncologist since May.  Her pain has started after that appointment.  Past Medical History:  Diagnosis Date  . Breast cancer ([place]) 2018  . Medical history non-contributory   . Urinary tract infection     Patient Active Problem List   Diagnosis Date Noted  . Malignant neoplasm of upper-outer quadrant of right breast in female, estrogen receptor negative ([place]) 03/15/2018  . Lumbar paraspinal muscle spasm 10/02/2015  . Active labor 10/18/2013  . 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 PUPPP (pruritic urticarial papules and plaques of pregnancy) 09/07/2013  . Sickle cell trait ([place]) 05/17/2013  . First trimester bleeding 05/17/2013  . Supervision of normal pregnancy in second trimester 05/17/2013  . GBS (group B streptococcus) UTI complicating pregnancy 61/60/7371    Past Surgical History:  Procedure Laterality Date  . BREAST LUMPECTOMY Right 09/16/2017  . DILATION AND CURETTAGE OF UTERUS    . MASTECTOMY    . PLACEMENT OF BREAST IMPLANTS       OB History    Gravida   3   Para  1   Term  1   Preterm  0   AB  2   Living  1     SAB  0   TAB  2   Ectopic  0   Multiple  0   Live Births  1           Family History  Problem Relation Age of Onset  . Hypertension Mother   . Cancer Paternal Uncle     Social History   Tobacco Use  . Smoking status: Former Smoker    Quit date: 03/16/2013    Years since quitting: 7.0  . Smokeless tobacco: Never Used  Vaping Use  . Vaping Use: Former  Substance Use Topics  . Alcohol use: Yes    Comment: occ  . Drug use: Yes    Types: Marijuana    Home Medications Prior to Admission medications   Medication Sig Start Date End Date Taking? Authorizing Provider  albuterol (VENTOLIN HFA) 108 (90 Base) MCG/ACT inhaler Inhale 2 puffs into the lungs every 6 (six) hours as needed for wheezing or shortness of breath. 08/28/19   Couture, Cortni S, PA-C  cephALEXin (KEFLEX) 500 MG capsule Take 1 capsule (500 mg total) by mouth 4 (four) times daily. 08/15/18   Horton, Barbette Hair, MD  dronabinol (MARINOL) 5 MG capsule Take by mouth. 02/25/18   [provider]  fluticasone (FLONASE) 50 MCG/ACT nasal spray Place 2 sprays into both nostrils daily. 08/28/19   Couture, Cortni S, PA-C  ibuprofen (ADVIL) 600 MG tablet Take 1 tablet (600 mg total) by mouth every 6 (six) hours as needed for up to 30 doses for mild pain or moderate pain. 08/15/19   Wyvonnia Dusky, MD  ibuprofen (ADVIL,MOTRIN) 600 MG tablet Take 1 tablet (600 mg total) by mouth every 6 (six) hours as needed. 08/15/18   Horton, Barbette Hair, MD  lidocaine (LIDODERM) 5 % Place 1 patch onto the skin daily. Remove & Discard patch within 12 hours or as directed by MD 10/08/19   Randal Buba, April, MD  naproxen (NAPROSYN) 375 MG tablet Take 1 tablet (375 mg total) by mouth 2 (two) times daily. 04/16/20   Varney Biles, MD  nitrofurantoin, macrocrystal-monohydrate, (MACROBID) 100 MG capsule Take 1 capsule (100 mg total) by mouth 2 (two) times daily. 10/08/19    Palumbo, April, MD  ondansetron (ZOFRAN ODT) 4 MG disintegrating tablet 4mg  ODT q4 hours prn nausea/vomit 11/18/18   Jacqlyn Larsen, PA-C  oseltamivir (TAMIFLU) 75 MG capsule Take 1 capsule (75 mg total) by mouth every 12 (twelve) hours. 11/18/18   Jacqlyn Larsen, PA-C    Allergies    Patient has no known allergies.  Review of Systems   Review of Systems  Constitutional: Positive for activity change. Negative for fever.  Respiratory: Negative for cough and shortness of breath.   Cardiovascular: Positive for chest pain.  Musculoskeletal: Positive for back pain.  Allergic/Immunologic: Positive for immunocompromised state.    Physical Exam Updated Vital Signs BP 105/70   Pulse 75   Temp 98.8 F (37.1 C) (Oral)   Resp 11   Ht 5\' 2"  (1.575 m)   Wt 45.4 kg   SpO2 100%   BMI 18.29 kg/m   Physical Exam Vitals and nursing note reviewed.  Constitutional:      Appearance: She is well-developed.  HENT:     Head: Normocephalic and atraumatic.  Cardiovascular:     Rate and Rhythm: Normal rate.     Heart sounds: Normal heart sounds.  Pulmonary:     Effort: Pulmonary effort is normal.  Abdominal:     General: Bowel sounds are normal.  Musculoskeletal:     Cervical back: Normal range of motion and neck supple.  Skin:    General: Skin is warm and dry.  Neurological:     Mental Status: She is alert and oriented to person, place, and time.     ED Results / Procedures / Treatments   Labs (all labs ordered are listed, but only abnormal results are displayed) Labs Reviewed  BASIC METABOLIC PANEL - Abnormal; Notable for the following components:      Result Value   Glucose, Bld 110 (*)    All other components within normal limits  CBC - Abnormal; Notable for the following components:   RBC 3.64 (*)    Hemoglobin 10.7 (*)    HCT 32.3 (*)    All other components within normal limits  I-STAT BETA HCG BLOOD, ED (MC, WL, AP ONLY)  TROPONIN I (HIGH SENSITIVITY)  TROPONIN I (HIGH  SENSITIVITY)    EKG EKG Interpretation  Date/Time:  Monday April 15 2020 23:44:10 EDT Ventricular Rate:  76 PR Interval:  140 QRS Duration: 70 QT Interval:  372 QTC Calculation: 418 R Axis:   79 Text Interpretation: Normal sinus rhythm with sinus arrhythmia Normal ECG No significant  change since last tracing Confirmed by Merrily Pew [phone]) on 04/16/2020 3:30:22 AM   Radiology DG Chest 2 View  Result Date: 04/16/2020 CLINICAL DATA:  Chest pain.  Breast cancer. EXAM: CHEST - 2 VIEW COMPARISON:  August 15, 2019 FINDINGS: There is a well-positioned left-sided Port-A-Cath in place. A surgical clip projects over the right breast. There is no pneumothorax. No large pleural effusion. IMPRESSION: No active cardiopulmonary disease. Electronically Signed   By: Constance Holster M.D.   On: 04/16/2020 00:41    Procedures Procedures (including critical care time)  Medications Ordered in ED Medications  sodium chloride flush (NS) 0.9 % injection 3 mL (has no administration in time range)  heparin lock flush 100 unit/mL (has no administration in time range)  HYDROmorphone (DILAUDID) injection 1 mg (1 mg Intravenous Given 04/16/20 0924)  ketorolac (TORADOL) 15 MG/ML injection 15 mg (15 mg Intravenous Given 04/16/20 0916)  prochlorperazine (COMPAZINE) injection 10 mg (10 mg Intravenous Given 04/16/20 0919)  diphenhydrAMINE (BENADRYL) injection 25 mg (25 mg Intravenous Given 04/16/20 0913)    ED Course  I have reviewed the triage vital signs and the nursing notes.  Pertinent labs & imaging results that were available during my care of the patient were reviewed by me and considered in my medical decision making (see chart for details).    MDM Rules/Calculators/A&P                          34 year old female comes in a chief complaint of midsternal chest pain and back pain.  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  I reviewed patient's CT scan from May.  She  had some lytic lesions over the sternum, and other areas in her chest/bones.  I also reviewed the CT scan of the brain she had earlier this month at outside facility, which did not reveal any evidence of bleed or large metastatic lesion.  The headaches are fairly constant and that there is no positional component to it.  No photophobia.  ICP is less likely.  She can have conditions like cerebral dural venous thrombosis.  For now we will focus on symptom control, but she has been made aware to return to the ER if she starts developing worsening headaches or has new neurologic symptoms.  I think her cancer doctor needs to be made aware of this complaint so that they can also ensure that is not a side effect of her medications and they can order some diagnostic work-up like CT venogram or MRI if needed.  As far as her pain in the chest is concerned.  The pain is not pleuritic it is reproducible with palpation of the sternum.  The CT scan is confirming that she had some lytic lesions over the sternum.  There is no new cough.  We did consider PE in the differential diagnosis but it appears that the pain is likely related to the cancer itself.  We will focus on pain control for this visit.  I will send a message to her oncologist at [facility] to see if they can see her again for optimal management.  Reassessment at 10:00: Patient is feeling a lot better.  She is noted to be quite sleepy.  We will reassess her again to ensure that she is safe for discharge.  Reassessment 11: Patient is still sleepy but is definitely more alert now.  I have discussed with her strict ER return  precautions and the need for her to follow-up with [facility].  She asked if she needs any prescriptions for pain control.  She had informed me earlier that she already has hydromorphone and morphine at home, she is just not using it.  I advised her to start taking 1 of those agents orally for now and to follow-up with the oncology team for optimal  management of her pain   I AM UNABLE TO SEND EPIC MESSAGE TO THE ONCOLOGIST AT [facility] ABOUT THIS VISIT Final Clinical Impression(s) / ED Diagnoses Final diagnoses:  Cancer associated pain    Rx / DC Orders ED Discharge Orders         Ordered    naproxen (NAPROSYN) 375 MG tablet  2 times daily     Discontinue  Reprint     04/16/20 1103           Varney Biles, MD 04/16/20 [place], Bettymae Yott, MD 04/16/20 1211

## 2020-04-16 NOTE — ED Notes (Signed)
Patient verbalizes understanding of discharge instructions. Opportunity for questioning and answers were provided. Pt discharged from ED. 

## 2020-04-16 NOTE — ED Notes (Signed)
Implanted port accessed; blood return noted

## 2020-04-16 NOTE — ED Notes (Signed)
Port a cath flushed with heparin and deaccessed

## 2020-04-16 NOTE — Discharge Instructions (Addendum)
We recommend that you start taking oral Dilaudid for your pain control as prescribed. In addition you can take Naprosyn for additional pain support.  It is prudent that you follow-up with your oncologist for optimal management - SO PLEASE CALL THEM FOR ANOTHER APPOINTMENT.

## 2020-05-15 ENCOUNTER — Telehealth: Payer: Self-pay

## 2020-05-15 NOTE — Telephone Encounter (Signed)
Received message to call patient regarding pain medications. Spoke with patient who shared that she had an appointment with her oncologists today and [facility] [address] [facility] [place] but she is currently staying with her mom in [place]. She was told because [facility] scripts that the insurance would not pay for her to pick them up in [place]. 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 Patient was not able to take her previously scheduled pain medications because she would vomit after taking pain meds. Her mom is now giving it with her antiemetic. New orders include cyclobenzaprine and dexamethasone.   [facility] [place] and in [place]. Medications to be filled in [place] and insurance will cover medications. Patient updated and  that meds will be ready in 30 minutes. Reminded patient of appointment with Palliative NP on Wednesday and that if needed, it can be changed to telehealth if she is still in [place]. Patient to call if it is needed to be changed

## 2020-05-22 ENCOUNTER — Other Ambulatory Visit: Payer: Medicaid Other | Admitting: Internal Medicine

## 2020-05-22 DIAGNOSIS — Z515 Encounter for palliative care: Secondary | ICD-10-CM

## 2020-05-22 DIAGNOSIS — C50919 Malignant neoplasm of unspecified site of unspecified female breast: Secondary | ICD-10-CM

## 2020-05-22 NOTE — Progress Notes (Addendum)
[facility] Palliative Care Consult Note Telephone: [phone]  Fax: [phone]  PATIENT NAME: Mariah Wallace DOB: 08-02-1986 MRN: [id number]  PRIMARY CARE PROVIDER:   System, Pcp Not In  REFERRING PROVIDER: Dr. Lemont Fillers  RESPONSIBLE PARTY:   Self     RECOMMENDATIONS and PLAN:  Palliative care encounter Z51.5  1. Advance care planning:  Health care treatment plan review consists of  Attempt CPR, full scope of treatment with antibiotics, IV fluids and no feeding tube placement. MOST form is complete and in patient's Epic chart. Pt desires to focus on quality of life rather than quantity of life. Encouraged to complete business documents and arrangements for she and her daughter. Discuss plans with social worker as needed.  Palliative care will followup with patient in aprox 4 weeks.  Support given  2.  Metastatic breast cancer:  Additional decline of disease.  Desires to continue chemotherapy and immunotherapy. Explanation given when a decision to transition to hospice care would occur(< 6 month prognosis without advanced therapy treatments).  Pending additional evaluations.    3.  Cancer related pain: Encouraged to restart extended release Morphine 15mg  every 12hours and Dilaudid every 4 hours if needed for break through pain.  Dexamethasone and Flexeril 5mg  as prescribed for management of headaches.  Monitor for need of additional adjustments.   4. Constipation: Related to chronic use of opiods.  Restart use of Senna 2 tablets BID and Miralax/Dulcolax suppository today.  Titrate to good bowel function at least every 3 days.   I spent 90 minutes providing this consultation,  from 0915 to 1045. More than 50% of the time in this consultation was spent coordinating communication with patient.   HISTORY OF PRESENT ILLNESS:  Mariah Wallace is a 33 y.o. 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  She has been receiving chemotherapy treatments. .  Energy has improved. Palliative Care was asked to help address goals of care.   CODE STATUS: FULL CODE  PPS: 60% HOSPICE ELIGIBILITY/DIAGNOSIS: TBD  PAST MEDICAL HISTORY:  Past Medical History:  Diagnosis Date  . Breast cancer ([place]) 2018  . Medical history non-contributory   . Urinary tract infection      ALLERGIES: No Known Allergies    PERTINENT MEDICATIONS:  Outpatient Encounter Medications as of 02/01/2020  Medication Sig  . albuterol (VENTOLIN HFA) 108 (90 Base) MCG/ACT inhaler Inhale 2 puffs into the lungs every 6 (six) hours as needed for wheezing or shortness of breath.  . cephALEXin (KEFLEX) 500 MG capsule Take 1 capsule (500 mg total) by mouth 4 (four) times daily.  Marland Kitchen dronabinol (MARINOL) 5 MG capsule Take by mouth.  . fluticasone (FLONASE) 50 MCG/ACT nasal spray Place 2 sprays into both nostrils daily.  Marland Kitchen ibuprofen (ADVIL) 600 MG tablet Take 1 tablet (600 mg total) by mouth every 6 (six) hours as needed for up to 30 doses for mild pain or moderate pain.  Marland Kitchen ibuprofen (ADVIL,MOTRIN) 600 MG tablet Take 1 tablet (600 mg total) by mouth every 6 (six) hours as needed.  . lidocaine (LIDODERM) 5 % Place 1 patch onto the skin daily. Remove & Discard patch within 12 hours or as directed by MD  . naproxen (NAPROSYN) 500 MG tablet Take 1 tablet (500 mg total) by mouth 2 (two) times daily.  . nitrofurantoin, macrocrystal-monohydrate, (MACROBID) 100 MG capsule Take 1 capsule (100 mg total) by mouth 2 (two) times  daily.  . ondansetron (ZOFRAN ODT) 4 MG disintegrating tablet 4mg  ODT q4 hours prn nausea/vomit  . oseltamivir (TAMIFLU) 75 MG capsule Take 1 capsule (75 mg total) by mouth every 12 (twelve) hours.   No facility-administered encounter medications on file as of 02/01/2020.    PHYSICAL EXAM:   Weight =96#  General: NAD,  Thin female that appears chronically ill.                 Cardiovascular: regular  rate and rhythm Pulmonary: rales of R base Abdomen: soft, nontender, + bowel sounds Extremities: no edema, no joint deformities Skin: multiple firm nodules scattered in scalp, face and trunk Neurological: A&O Weakness but otherwise nonfocal Psych:  Calm and cooperative. Listened attentively.  Gonzella Lex, NP-C

## 2020-05-23 ENCOUNTER — Other Ambulatory Visit: Payer: Self-pay

## 2020-06-18 ENCOUNTER — Telehealth: Payer: Self-pay | Admitting: Internal Medicine

## 2020-06-18 DIAGNOSIS — Z515 Encounter for palliative care: Secondary | ICD-10-CM

## 2020-06-18 NOTE — Telephone Encounter (Signed)
Returned call to patient.  She states that she continues to have headaches (related to metastasis to her brain) and she has been taking Flexeril 5 mg 3 times per day to control these headaches.  She is now out of Flexeril prescription that was originally prescribed by Oncology NP in August.  She also states that the Flexeril improves her pain better than her normal supply of morphine.  She also is requesting to have palliative care services through her providers at [facility] now that she is located primarily in [place] and much less in [place].  She continues to take radiation treatments daily to her brain along with chemotherapy and finds it very difficult to drive especially when she takes her analgesics or Flexeril.  I informed her that this provider had attempted to return calls to her social worker to further discuss logistics of getting her prescription filled for Flexeril and with further discussion of palliative care being provided through [facility].  I will continue to attempt to contact the social worker and if oncology NP is not available, I plan on refilling Flexeril in the interim.  Patient was appreciative of conversation.  Support given.  Gonzella Lex, NP

## 2020-06-18 NOTE — Telephone Encounter (Signed)
Returned call to [facility] social worker Almyra Free who would like to clarify Flexeril Rx for patient. As per discussion with Ms. Vanness, she noted that the oncology NP prescribed Flexeril 5mg  PO TID to treat headaches.  NP is not available currently and patient has a pending new patient appointment with a Palliative care provider at [facility] on 07/02/20.  Patient would like to establish with palliative at [facility] since she now lives the majority of time in [place] with her mother to be closer to the hospital where she continues to receive treatments for cancer daily.   She is out of this rx that was filled on 05/15/2020.  This provider agrees to fill a temporary rx of Flexeril 5mg  #42 Take 1 PO every 8hrs as needed for headache and previously prescribed.  [facility] [address] [facility] [place] additional refills will be per [employer].  [facility] will remain as palliative provider until patient officially establishes with [facility] Palliative care.  Phoned pt to inform her of Rx refill but voicemail was received.  I phoned and spoke to pt's mother to inform her of refill and the pharmacy location.  She stated that patient had just completed radiation therapy and was asleep.  She agreed to pickup Rx for this patient and thanked me for same.  Support given.  Gonzella Lex, NP-C

## 2020-07-29 DEATH — deceased
